# Patient Record
Sex: Female | Born: 1960 | Race: Black or African American | Hispanic: No | Smoking: Never smoker
Health system: Southern US, Community
[De-identification: ages and names within clinical notes are randomized; demographics above are authoritative.]

## PROBLEM LIST (undated history)

## (undated) DIAGNOSIS — K769 Liver disease, unspecified: Secondary | ICD-10-CM

## (undated) DIAGNOSIS — E785 Hyperlipidemia, unspecified: Secondary | ICD-10-CM

## (undated) DIAGNOSIS — R87619 Unspecified abnormal cytological findings in specimens from cervix uteri: Secondary | ICD-10-CM

## (undated) DIAGNOSIS — K5190 Ulcerative colitis, unspecified, without complications: Secondary | ICD-10-CM

## (undated) DIAGNOSIS — K5792 Diverticulitis of intestine, part unspecified, without perforation or abscess without bleeding: Secondary | ICD-10-CM

## (undated) HISTORY — DX: Ulcerative colitis, unspecified, without complications: K51.90

## (undated) HISTORY — PX: VARICOSE VEIN SURGERY: SHX832

## (undated) HISTORY — PX: COLONOSCOPY: SHX174

## (undated) HISTORY — PX: OTHER SURGICAL HISTORY: SHX169

## (undated) HISTORY — DX: Diverticulitis of intestine, part unspecified, without perforation or abscess without bleeding: K57.92

## (undated) HISTORY — DX: Unspecified abnormal cytological findings in specimens from cervix uteri: R87.619

## (undated) HISTORY — DX: Liver disease, unspecified: K76.9

## (undated) HISTORY — DX: Hyperlipidemia, unspecified: E78.5

## (undated) HISTORY — PX: DILATION AND CURETTAGE OF UTERUS: SHX78

---

## 1987-10-25 HISTORY — PX: TUBAL LIGATION: SHX77

## 2002-10-24 DIAGNOSIS — R87619 Unspecified abnormal cytological findings in specimens from cervix uteri: Secondary | ICD-10-CM

## 2002-10-24 HISTORY — PX: COLPOSCOPY: SHX161

## 2002-10-24 HISTORY — DX: Unspecified abnormal cytological findings in specimens from cervix uteri: R87.619

## 2004-08-10 ENCOUNTER — Emergency Department: Payer: Self-pay | Admitting: Emergency Medicine

## 2004-10-21 ENCOUNTER — Ambulatory Visit: Payer: Self-pay

## 2005-01-13 ENCOUNTER — Ambulatory Visit: Payer: Self-pay

## 2005-12-16 ENCOUNTER — Ambulatory Visit: Payer: Self-pay

## 2007-01-15 ENCOUNTER — Ambulatory Visit: Payer: Self-pay

## 2008-01-17 ENCOUNTER — Ambulatory Visit: Payer: Self-pay

## 2008-11-04 ENCOUNTER — Ambulatory Visit: Payer: Self-pay

## 2009-02-05 ENCOUNTER — Ambulatory Visit: Payer: Self-pay

## 2009-11-16 ENCOUNTER — Ambulatory Visit: Payer: Self-pay | Admitting: Internal Medicine

## 2010-02-09 ENCOUNTER — Ambulatory Visit: Payer: Self-pay

## 2010-11-03 ENCOUNTER — Ambulatory Visit: Payer: Self-pay | Admitting: Internal Medicine

## 2011-03-02 ENCOUNTER — Ambulatory Visit: Payer: Self-pay

## 2012-03-28 ENCOUNTER — Ambulatory Visit: Payer: Self-pay

## 2013-05-28 ENCOUNTER — Ambulatory Visit: Payer: Self-pay | Admitting: Physical Medicine and Rehabilitation

## 2013-05-28 ENCOUNTER — Ambulatory Visit: Payer: Self-pay

## 2014-04-03 DIAGNOSIS — M5126 Other intervertebral disc displacement, lumbar region: Secondary | ICD-10-CM | POA: Insufficient documentation

## 2014-04-06 DIAGNOSIS — E7800 Pure hypercholesterolemia, unspecified: Secondary | ICD-10-CM | POA: Insufficient documentation

## 2014-05-30 DIAGNOSIS — K219 Gastro-esophageal reflux disease without esophagitis: Secondary | ICD-10-CM | POA: Insufficient documentation

## 2014-09-10 ENCOUNTER — Ambulatory Visit: Payer: Self-pay | Admitting: Internal Medicine

## 2014-09-17 ENCOUNTER — Ambulatory Visit: Payer: Self-pay | Admitting: Gastroenterology

## 2015-07-21 LAB — HM PAP SMEAR: HM Pap smear: NEGATIVE

## 2015-09-07 ENCOUNTER — Other Ambulatory Visit: Payer: Self-pay | Admitting: Unknown Physician Specialty

## 2015-09-07 DIAGNOSIS — Z1231 Encounter for screening mammogram for malignant neoplasm of breast: Secondary | ICD-10-CM

## 2015-09-16 ENCOUNTER — Ambulatory Visit
Admission: RE | Admit: 2015-09-16 | Discharge: 2015-09-16 | Disposition: A | Discharge status: Home / Self Care | Payer: BC Managed Care – PPO | Source: Ambulatory Visit | Attending: Unknown Physician Specialty | Admitting: Unknown Physician Specialty

## 2015-09-16 DIAGNOSIS — Z1231 Encounter for screening mammogram for malignant neoplasm of breast: Secondary | ICD-10-CM | POA: Insufficient documentation

## 2016-09-07 DIAGNOSIS — K7581 Nonalcoholic steatohepatitis (NASH): Secondary | ICD-10-CM | POA: Insufficient documentation

## 2016-09-20 ENCOUNTER — Other Ambulatory Visit: Payer: Self-pay | Admitting: Obstetrics and Gynecology

## 2016-09-20 DIAGNOSIS — Z13820 Encounter for screening for osteoporosis: Secondary | ICD-10-CM

## 2016-09-20 DIAGNOSIS — Z1231 Encounter for screening mammogram for malignant neoplasm of breast: Secondary | ICD-10-CM

## 2016-10-11 ENCOUNTER — Ambulatory Visit: Payer: BC Managed Care – PPO

## 2016-10-11 ENCOUNTER — Other Ambulatory Visit: Payer: BC Managed Care – PPO

## 2016-11-07 ENCOUNTER — Other Ambulatory Visit: Payer: Self-pay | Admitting: Obstetrics and Gynecology

## 2016-11-07 ENCOUNTER — Ambulatory Visit
Admission: RE | Admit: 2016-11-07 | Discharge: 2016-11-07 | Disposition: A | Discharge status: Home / Self Care | Payer: BC Managed Care – PPO | Source: Ambulatory Visit | Attending: Obstetrics and Gynecology | Admitting: Obstetrics and Gynecology

## 2016-11-07 DIAGNOSIS — M85851 Other specified disorders of bone density and structure, right thigh: Secondary | ICD-10-CM | POA: Diagnosis not present

## 2016-11-07 DIAGNOSIS — Z1231 Encounter for screening mammogram for malignant neoplasm of breast: Secondary | ICD-10-CM | POA: Diagnosis not present

## 2016-11-07 DIAGNOSIS — Z13820 Encounter for screening for osteoporosis: Secondary | ICD-10-CM | POA: Insufficient documentation

## 2016-11-07 LAB — HM MAMMOGRAPHY: HM MAMMO: NORMAL (ref 0–4)

## 2017-09-01 ENCOUNTER — Encounter: Payer: Self-pay | Admitting: Obstetrics and Gynecology

## 2017-09-04 ENCOUNTER — Encounter: Payer: Self-pay | Admitting: Obstetrics and Gynecology

## 2017-09-04 ENCOUNTER — Ambulatory Visit (INDEPENDENT_AMBULATORY_CARE_PROVIDER_SITE_OTHER): Payer: BC Managed Care – PPO | Admitting: Obstetrics and Gynecology

## 2017-09-04 VITALS — BP 118/80 | HR 85 | Ht 63.0 in | Wt 137.0 lb

## 2017-09-04 DIAGNOSIS — Z1231 Encounter for screening mammogram for malignant neoplasm of breast: Secondary | ICD-10-CM | POA: Diagnosis not present

## 2017-09-04 DIAGNOSIS — N952 Postmenopausal atrophic vaginitis: Secondary | ICD-10-CM | POA: Diagnosis not present

## 2017-09-04 DIAGNOSIS — Z1239 Encounter for other screening for malignant neoplasm of breast: Secondary | ICD-10-CM

## 2017-09-04 DIAGNOSIS — Z01419 Encounter for gynecological examination (general) (routine) without abnormal findings: Secondary | ICD-10-CM | POA: Diagnosis not present

## 2017-09-04 DIAGNOSIS — Z124 Encounter for screening for malignant neoplasm of cervix: Secondary | ICD-10-CM | POA: Diagnosis not present

## 2017-09-04 MED ORDER — VALACYCLOVIR HCL 1 G PO TABS: 1000.0000 mg | ORAL_TABLET | Freq: Every day | ORAL | 6 refills | Status: DC

## 2017-09-04 MED ORDER — ESTROGENS, CONJUGATED 0.625 MG/GM VA CREA: 1.0000 | TOPICAL_CREAM | VAGINAL | 3 refills | Status: DC

## 2017-09-04 NOTE — Progress Notes (Signed)
Gynecology Annual Exam  PCP: Kirk Ruths, MD  Chief Complaint:  Chief Complaint  Patient presents with  . Gynecologic Exam    History of Present Illness:Patient is a 56 y.o. G2P2002 presents for annual exam. The patient has no complaints today.   LMP: No LMP recorded. Patient is postmenopausal. No postmenopausal bleeding  The patient is sexually active. She does report dyspareunia, is interested in restarting premarin.  The patient does perform self breast exams.  There is no notable family history of breast or ovarian cancer in her family.  The patient wears seatbelts: yes.   The patient has regular exercise: not asked.    The patient denies current symptoms of depression.     Review of Systems: Review of Systems  Constitutional: Negative for chills and fever.  HENT: Negative for congestion.   Respiratory: Negative for cough and shortness of breath.   Cardiovascular: Negative for chest pain and palpitations.  Gastrointestinal: Negative for abdominal pain, constipation, diarrhea, heartburn, nausea and vomiting.  Genitourinary: Negative for dysuria, frequency and urgency.  Skin: Negative for itching and rash.  Neurological: Negative for dizziness and headaches.  Endo/Heme/Allergies: Negative for polydipsia.  Psychiatric/Behavioral: Negative for depression.    Past Medical History:  Past Medical History:  Diagnosis Date  . Abnormal Pap smear of cervix 2004  . Diverticulitis   . Hyperlipidemia   . Liver disease    Fatty liver  . Ulcerative colitis [facility])     Past Surgical History:  Past Surgical History:  Procedure Laterality Date  . COLONOSCOPY    . COLPOSCOPY  2004  . DILATION AND CURETTAGE OF UTERUS    . TUBAL LIGATION  1989  . VARICOSE VEIN SURGERY    . vulvar hematoma      Gynecologic History:  No LMP recorded. Patient is postmenopausal. Last Pap: Results were: 07/21/2015 NIL and HR HPV negative  Last mammogram: 11/07/2016 Results were: BI-RAD  I  [place] 04/05/2016 Normal with EMS of 2.32mm obtained for an episode of postmenopausal bleeding  Obstetric History: [id number]  Family History:  Family History  Problem Relation Age of Onset  . Breast cancer Paternal Aunt 37  . Breast cancer Maternal Grandmother 80  . Diabetes Mother   . Hypertension Mother   . Prostate cancer Father   . Colon cancer Paternal Aunt     Social History:  Social History   Socioeconomic History  . Marital status: Widowed    Spouse name: Not on file  . Number of children: Not on file  . Years of education: Not on file  . Highest education level: Not on file  Social Needs  . Financial resource strain: Not on file  . Food insecurity - worry: Not on file  . Food insecurity - inability: Not on file  . Transportation needs - medical: Not on file  . Transportation needs - non-medical: Not on file  Occupational History  . Not on file  Tobacco Use  . Smoking status: Never Smoker  . Smokeless tobacco: Never Used  Substance and Sexual Activity  . Alcohol use: No    Frequency: Never  . Drug use: No  . Sexual activity: Yes    Partners: Male    Birth control/protection: Post-menopausal  Other Topics Concern  . Not on file  Social History Narrative  . Not on file    Allergies:  No Known Allergies  Medications: Prior to Admission medications   Not on File    Physical Exam  Vitals: Blood pressure 118/80, pulse 85, height 5\' 3"  (1.6 m), weight 137 lb (62.1 kg).  General: NAD HEENT: normocephalic, anicteric Thyroid: no enlargement, no palpable nodules Pulmonary: No increased work of breathing, CTAB Cardiovascular: RRR, distal pulses 2+ Breast: Breast symmetrical, no tenderness, no palpable nodules or masses, no skin or nipple retraction present, no nipple discharge.  No axillary or supraclavicular lymphadenopathy. Abdomen: NABS, soft, non-tender, non-distended.  Umbilicus without lesions.  No hepatomegaly, splenomegaly or masses palpable. No  evidence of hernia  Genitourinary:  External: Normal external female genitalia.  Normal urethral meatus, normal  Bartholin's and Skene's glands.    Vagina: Normal vaginal mucosa, no evidence of prolapse, some loss of vaginal rugae consistent with vaginal atrophy.    Cervix: Grossly normal in appearance, no bleeding  Uterus: Non-enlarged, mobile, normal contour.  No CMT  Adnexa: ovaries non-enlarged, no adnexal masses  Rectal: deferred  Lymphatic: no evidence of inguinal lymphadenopathy Extremities: no edema, erythema, or tenderness Neurologic: Grossly intact Psychiatric: mood appropriate, affect full  Female chaperone present for pelvic and breast  portions of the physical exam     Assessment: 56 y.o. G2P2002 routine annual exam  Plan: Problem List Items Addressed This Visit    None    Visit Diagnoses    Vaginal atrophy    -  Primary   Screening for malignant neoplasm of cervix       Relevant Orders   PapIG, HPV, rfx 16/18   Breast screening       Relevant Orders   MM DIGITAL SCREENING BILATERAL   Encounter for gynecological examination without abnormal finding       Relevant Orders   PapIG, HPV, rfx 16/18      1) Mammogram - recommend yearly screening mammogram.  Mammogram Is up to date  2) STI screening was not offered  3) ASCCP guidelines and rational discussed.  Patient opts for every 3 years screening interval  4) Osteopenia - per USPTF routine screening DEXA at age 39 given UC and steroid exposure obtained 11/07/16 with T score -2.1 (osteopenia) Hip 0.745g/cm^2  - FRAX 10 year major fracture risk 8.4%,  10 year hip fracture risk 1.2% - Follow up DEXA 2 years 2020  Consider FDA-approved medical therapies in postmenopausal women and men aged 67 years and older, based on the following: a) A hip or vertebral (clinical or morphometric) fracture b) T-score ? -2.5 at the femoral neck or spine after appropriate evaluation to exclude secondary causes C) Low bone mass  (T-score between -1.0 and -2.5 at the femoral neck or spine) and a 10-year probability of a hip fracture ? 3% or a 10-year probability of a major osteoporosis-related fracture ? 20% based on the [place]-adapted WHO algorithm   5) Routine healthcare maintenance including cholesterol, diabetes screening discussed managed by PCP  6) Colonoscopy - up to date last 05/01/2014  7) Vaginal atrophy - rx premarin  8) Follow up 1 year for routine annual

## 2017-09-04 NOTE — Patient Instructions (Signed)
Preventive Care 40-64 Years, Female Preventive care refers to lifestyle choices and visits with your health care provider that can promote health and wellness. What does preventive care include?  A yearly physical exam. This is also called an annual well check.  Dental exams once or twice a year.  Routine eye exams. Ask your health care provider how often you should have your eyes checked.  Personal lifestyle choices, including: ? Daily care of your teeth and gums. ? Regular physical activity. ? Eating a healthy diet. ? Avoiding tobacco and drug use. ? Limiting alcohol use. ? Practicing safe sex. ? Taking low-dose aspirin daily starting at age 56. ? Taking vitamin and mineral supplements as recommended by your health care provider. What happens during an annual well check? The services and screenings done by your health care provider during your annual well check will depend on your age, overall health, lifestyle risk factors, and family history of disease. Counseling Your health care provider may ask you questions about your:  Alcohol use.  Tobacco use.  Drug use.  Emotional well-being.  Home and relationship well-being.  Sexual activity.  Eating habits.  Work and work Statistician.  Method of birth control.  Menstrual cycle.  Pregnancy history.  Screening You may have the following tests or measurements:  Height, weight, and BMI.  Blood pressure.  Lipid and cholesterol levels. These may be checked every 5 years, or more frequently if you are over 56 years old.  Skin check.  Lung cancer screening. You may have this screening every year starting at age 56 if you have a 30-pack-year history of smoking and currently smoke or have quit within the past 15 years.  Fecal occult blood test (FOBT) of the stool. You may have this test every year starting at age 56.  Flexible sigmoidoscopy or colonoscopy. You may have a sigmoidoscopy every 5 years or a colonoscopy  every 10 years starting at age 56.  Hepatitis C blood test.  Hepatitis B blood test.  Sexually transmitted disease (STD) testing.  Diabetes screening. This is done by checking your blood sugar (glucose) after you have not eaten for a while (fasting). You may have this done every 1-3 years.  Mammogram. This may be done every 1-2 years. Talk to your health care provider about when you should start having regular mammograms. This may depend on whether you have a family history of breast cancer.  BRCA-related cancer screening. This may be done if you have a family history of breast, ovarian, tubal, or peritoneal cancers.  Pelvic exam and Pap test. This may be done every 3 years starting at age 56. Starting at age 56, this may be done every 5 years if you have a Pap test in combination with an HPV test.  Bone density scan. This is done to screen for osteoporosis. You may have this scan if you are at high risk for osteoporosis.  Discuss your test results, treatment options, and if necessary, the need for more tests with your health care provider. Vaccines Your health care provider may recommend certain vaccines, such as:  Influenza vaccine. This is recommended every year.  Tetanus, diphtheria, and acellular pertussis (Tdap, Td) vaccine. You may need a Td booster every 10 years.  Varicella vaccine. You may need this if you have not been vaccinated.  Zoster vaccine. You may need this after age 5.  Measles, mumps, and rubella (MMR) vaccine. You may need at least one dose of MMR if you were born in  1957 or later. You may also need a second dose.  Pneumococcal 13-valent conjugate (PCV13) vaccine. You may need this if you have certain conditions and were not previously vaccinated.  Pneumococcal polysaccharide (PPSV23) vaccine. You may need one or two doses if you smoke cigarettes or if you have certain conditions.  Meningococcal vaccine. You may need this if you have certain  conditions.  Hepatitis A vaccine. You may need this if you have certain conditions or if you travel or work in places where you may be exposed to hepatitis A.  Hepatitis B vaccine. You may need this if you have certain conditions or if you travel or work in places where you may be exposed to hepatitis B.  Haemophilus influenzae type b (Hib) vaccine. You may need this if you have certain conditions.  Talk to your health care provider about which screenings and vaccines you need and how often you need them. This information is not intended to replace advice given to you by your health care provider. Make sure you discuss any questions you have with your health care provider. Document Released: 11/06/2015 Document Revised: 06/29/2016 Document Reviewed: 08/11/2015 Elsevier Interactive Patient Education  2017 [employer].

## 2017-09-06 LAB — PAPIG, HPV, RFX 16/18
HPV, HIGH-RISK: NEGATIVE
PAP SMEAR COMMENT: 0

## 2017-11-24 ENCOUNTER — Encounter: Payer: Self-pay | Admitting: Obstetrics and Gynecology

## 2017-11-24 ENCOUNTER — Ambulatory Visit: Payer: BC Managed Care – PPO | Admitting: Obstetrics and Gynecology

## 2017-11-24 ENCOUNTER — Ambulatory Visit
Admission: RE | Admit: 2017-11-24 | Discharge: 2017-11-24 | Disposition: A | Discharge status: Home / Self Care | Payer: BC Managed Care – PPO | Source: Ambulatory Visit | Attending: Obstetrics and Gynecology | Admitting: Obstetrics and Gynecology

## 2017-11-24 VITALS — BP 118/78 | HR 80 | Ht 63.0 in | Wt 139.0 lb

## 2017-11-24 DIAGNOSIS — N3090 Cystitis, unspecified without hematuria: Secondary | ICD-10-CM

## 2017-11-24 DIAGNOSIS — N760 Acute vaginitis: Secondary | ICD-10-CM | POA: Diagnosis not present

## 2017-11-24 DIAGNOSIS — Z1231 Encounter for screening mammogram for malignant neoplasm of breast: Secondary | ICD-10-CM | POA: Insufficient documentation

## 2017-11-24 DIAGNOSIS — Z1239 Encounter for other screening for malignant neoplasm of breast: Secondary | ICD-10-CM

## 2017-11-24 LAB — POCT URINALYSIS DIPSTICK
Bilirubin, UA: NEGATIVE
Glucose, UA: NEGATIVE
Ketones, UA: NEGATIVE
Nitrite, UA: NEGATIVE
PH UA: 5 (ref 5.0–8.0)
Protein, UA: NEGATIVE
SPEC GRAV UA: 1.01 (ref 1.010–1.025)
UROBILINOGEN UA: NEGATIVE U/dL — AB

## 2017-11-24 MED ORDER — PHENAZOPYRIDINE HCL 200 MG PO TABS: 200.0000 mg | ORAL_TABLET | Freq: Three times a day (TID) | ORAL | 1 refills | Status: DC | PRN

## 2017-11-24 MED ORDER — SULFAMETHOXAZOLE-TRIMETHOPRIM 800-160 MG PO TABS: 1.0000 | ORAL_TABLET | Freq: Two times a day (BID) | ORAL | 0 refills | Status: AC

## 2017-11-24 NOTE — Progress Notes (Signed)
HPI:      [name] is a 56 y.o. G2P2002 who LMP was No LMP recorded. Patient is postmenopausal., presents today for a problem visit.  She complains of a recent yeast infection. She was treated for this by her primary care physician who prescribed her diflucan 100mg for 7 days. She has seen white thick discharge without any odor. She has developed painful urination since she began her diflucan.   PMHx: She  has a past medical history of Abnormal Pap smear of cervix (2004), Diverticulitis, Hyperlipidemia, Liver disease, and Ulcerative colitis (HCC). Also,  has a past surgical history that includes Colonoscopy; Colposcopy (2004); Dilation and curettage of uterus; Tubal ligation (1989); Varicose vein surgery; and vulvar hematoma., family history includes Breast cancer (age of onset: 80) in her maternal grandmother and paternal aunt; Colon cancer in her paternal aunt; Diabetes in her mother; Hypertension in her mother; Prostate cancer in her father.,  reports that  has never smoked. she has never used smokeless tobacco. She reports that she does not drink alcohol or use drugs.  She has a current medication list which includes the following prescription(s): cholecalciferol, conjugated estrogens, dicyclomine, ketoconazole, mometasone, multi-vitamins, omeprazole, pramoxine-hc, suprep bowel prep kit, phenazopyridine, and sulfamethoxazole-trimethoprim. Also, is allergic to metronidazole.  Review of Systems  Constitutional: Negative for chills, fever, malaise/fatigue and weight loss.  HENT: Negative for congestion, hearing loss and sinus pain.   Eyes: Negative for blurred vision and double vision.  Respiratory: Negative for cough, sputum production, shortness of breath and wheezing.   Cardiovascular: Negative for chest pain, palpitations, orthopnea and leg swelling.  Gastrointestinal: Negative for abdominal pain, constipation, diarrhea, nausea and vomiting.  Genitourinary: Positive for dysuria.  Negative for flank pain, frequency, hematuria and urgency.  Musculoskeletal: Negative for back pain, falls and joint pain.  Skin: Negative for itching and rash.  Neurological: Negative for dizziness and headaches.  Psychiatric/Behavioral: Negative for depression, substance abuse and suicidal ideas. The patient is not nervous/anxious.     Objective: BP 118/78 (BP Location: Left Arm, Patient Position: Sitting, Cuff Size: Normal)   Pulse 80   Ht 5' 3" (1.6 m)   Wt 139 lb (63 kg)   BMI 24.62 kg/m  Physical Exam  Constitutional: She is oriented to person, place, and time. She appears well-developed.  Genitourinary: Vagina normal and uterus normal. There is no lesion on the right labia. There is no lesion on the left labia. Vagina exhibits no lesion. Right adnexum does not display mass. Left adnexum does not display mass. Cervix does not exhibit motion tenderness or discharge.  HENT:  Head: Normocephalic and atraumatic.  Eyes: EOM are normal.  Neck: Neck supple. No thyromegaly present.  Cardiovascular: Normal rate, regular rhythm and normal heart sounds.  Pulmonary/Chest: Effort normal and breath sounds normal. Right breast exhibits no inverted nipple, no mass, no nipple discharge and no skin change. Left breast exhibits no inverted nipple, no mass, no nipple discharge and no skin change.  Abdominal: Soft. Bowel sounds are normal. She exhibits no distension and no mass.  Neurological: She is alert and oriented to person, place, and time.  Skin: Skin is warm and dry.  Psychiatric: She has a normal mood and affect. Her behavior is normal. Judgment and thought content normal.  Vitals reviewed.   ASSESSMENT/PLAN:    Will treat patient for UTI. No sign of vaginitis on exam today. Will send Nuswab to confirm.  Problem List Items Addressed This Visit    None      Visit Diagnoses    Cystitis    -  Primary   Relevant Medications   sulfamethoxazole-trimethoprim (BACTRIM DS,SEPTRA DS) 800-160 MG  tablet   phenazopyridine (PYRIDIUM) 200 MG tablet   Other Relevant Orders   Urine Culture   Acute vaginitis       Relevant Orders   NuSwab BV and Candida, NAA      Nikolina Simerson MD 11/24/17 9:32 AM

## 2017-11-26 LAB — URINE CULTURE

## 2017-11-27 NOTE — Progress Notes (Signed)
Culture negative, called and left generic message.  Released to [place].

## 2017-11-28 LAB — NUSWAB BV AND CANDIDA, NAA
CANDIDA ALBICANS, NAA: NEGATIVE
Candida glabrata, NAA: NEGATIVE

## 2017-11-28 NOTE — Progress Notes (Signed)
Normal, released to mychart

## 2018-05-25 DIAGNOSIS — T466X5A Adverse effect of antihyperlipidemic and antiarteriosclerotic drugs, initial encounter: Secondary | ICD-10-CM | POA: Insufficient documentation

## 2018-11-05 ENCOUNTER — Ambulatory Visit (INDEPENDENT_AMBULATORY_CARE_PROVIDER_SITE_OTHER): Payer: BC Managed Care – PPO | Admitting: Obstetrics & Gynecology

## 2018-11-05 ENCOUNTER — Encounter: Payer: Self-pay | Admitting: Obstetrics & Gynecology

## 2018-11-05 VITALS — BP 120/80 | Ht 63.0 in | Wt 138.0 lb

## 2018-11-05 DIAGNOSIS — R300 Dysuria: Secondary | ICD-10-CM

## 2018-11-05 DIAGNOSIS — B3731 Acute candidiasis of vulva and vagina: Secondary | ICD-10-CM

## 2018-11-05 DIAGNOSIS — B373 Candidiasis of vulva and vagina: Secondary | ICD-10-CM

## 2018-11-05 DIAGNOSIS — N3090 Cystitis, unspecified without hematuria: Secondary | ICD-10-CM

## 2018-11-05 LAB — POCT URINALYSIS DIPSTICK
Bilirubin, UA: NEGATIVE
Blood, UA: NEGATIVE
Glucose, UA: NEGATIVE
Ketones, UA: NEGATIVE
NITRITE UA: NEGATIVE
PROTEIN UA: NEGATIVE
Spec Grav, UA: 1.015 (ref 1.010–1.025)
Urobilinogen, UA: 0.2 E.U./dL
pH, UA: 5 (ref 5.0–8.0)

## 2018-11-05 MED ORDER — SULFAMETHOXAZOLE-TRIMETHOPRIM 800-160 MG PO TABS: 1.0000 | ORAL_TABLET | Freq: Two times a day (BID) | ORAL | 0 refills | Status: AC

## 2018-11-05 MED ORDER — FLUCONAZOLE 150 MG PO TABS
150.0000 mg | ORAL_TABLET | Freq: Once | ORAL | 3 refills | Status: AC
Start: 2018-11-05 — End: 2018-11-05

## 2018-11-05 MED ORDER — CLOTRIMAZOLE-BETAMETHASONE 1-0.05 % EX CREA: 1.0000 "application " | TOPICAL_CREAM | Freq: Two times a day (BID) | CUTANEOUS | 0 refills | Status: AC

## 2018-11-05 NOTE — Patient Instructions (Signed)
Betamethasone; Clotrimazole skin cream What is this medicine? BETAMETHASONE; CLOTRIMAZOLE (bay ta METH a sone; kloe TRIM a zole) is a corticosteroid and antifungal cream. It treats ringworm and infections like jock itch and athlete's foot. It also helps reduce swelling, redness, and itching caused by these infections. This medicine may be used for other purposes; ask your health care provider or pharmacist if you have questions. COMMON BRAND NAME(S): Lotrisone What should I tell my health care provider before I take this medicine? They need to know if you have any of these conditions: -large areas of burned or damaged skin -skin thinning -peripheral vascular disease or poor circulation -an unusual or allergic reaction to betamethasone, clotrimazole, other corticosteroids, other antifungals, other medicines, foods, dyes, or preservatives -pregnant or trying to get pregnant -breast-feeding How should I use this medicine? This cream is for external use only. Do not take by mouth. Follow the directions on the prescription label. Wash your hands before and after use. If treating hand or nail infections, wash hands before use only. Apply a thin layer of cream to the affected area and rub in gently. Do not cover or wrap the treated area with an airtight bandage (like a plastic bandage). Use the cream for the full course of treatment prescribed, even if you think the condition is getting better. Use the medicine at regular intervals. Do not use more often than directed. Do not use on healthy skin or over large areas of skin. Do not use this medicine for any condition other than the one for which it was prescribed. When applying to the groin area, apply a small amount and do not use for longer than 2 weeks unless directed to by your doctor or health care professional. Do not get this cream in your eyes. If you do, rinse out with plenty of cool tap water. Talk to your pediatrician regarding the use of this  medicine in children. While this drug may be prescribed for children as young as 17 years for selected conditions, precautions do apply. Patients over 24 years old may have a stronger reaction and need a smaller dose. Overdosage: If you think you have taken too much of this medicine contact a poison control center or emergency room at once. NOTE: This medicine is only for you. Do not share this medicine with others. What if I miss a dose? If you miss a dose, use it as soon as you can. If it is almost time for your next dose, use only that dose. Do not use double or take extra doses. What may interact with this medicine? -topical products that have nystatin This list may not describe all possible interactions. Give your health care provider a list of all the medicines, herbs, non-prescription drugs, or dietary supplements you use. Also tell them if you smoke, drink alcohol, or use illegal drugs. Some items may interact with your medicine. What should I watch for while using this medicine? If using this medicine on your body or groin tell your doctor or health care professional if your symptoms do not improve within 1 week. If using this medicine on your feet tell your doctor or health care professional if your symptoms do not improve within 2 weeks. Tell your doctor if your skin infection returns after you stop using this cream. If you are using this cream for 'jock itch' be sure to dry the groin completely after bathing. Do not wear underwear that is tight-fitting or made from synthetic fibers like rayon or  nylon. Wear loose-fitting, cotton underwear. If you are using this cream for athlete's foot be sure to dry your feet carefully after bathing, especially between the toes. Do not wear socks made from wool or synthetic materials like rayon or nylon. Wear clean cotton socks and change them at least once a day, change them more if your feet sweat a lot. Also, try to wear sandals or shoes that are  well-ventilated. Do not use this cream to treat diaper rash. What side effects may I notice from receiving this medicine? Side effects that you should report to your doctor or health care professional as soon as possible: -allergic reactions like skin rash, itching or hives, swelling of the face, lips, or tongue -dark red spots on the skin -lack of healing of skin condition -loss of feeling on skin -painful, red, pus-filled blisters in hair follicles -skin infection -sores or blisters that do not heal properly -thinning of the skin or sunburn Side effects that usually do not require medical attention (report to your doctor or health care professional if they continue or are bothersome): -dry or peeling skin -minor skin irritation, burning, or itching This list may not describe all possible side effects. Call your doctor for medical advice about side effects. You may report side effects to FDA at 1-800-FDA-1088. Where should I keep my medicine? Keep out of the reach of children. Store at room temperature between 15 and 30 degrees C ( 59 and 86 degrees F). Do not freeze. Throw away any unused medicine after the expiration date. NOTE: This sheet is a summary. It may not cover all possible information. If you have questions about this medicine, talk to your doctor, pharmacist, or health care provider.  2019 Elsevier/Gold Standard (2008-01-09 16:14:28)

## 2018-11-05 NOTE — Addendum Note (Signed)
Addended by: Gae Dry on: 11/05/2018 05:00 PM   Modules accepted: Orders

## 2018-11-05 NOTE — Progress Notes (Signed)
HPI:      [name] is a 58 y.o. [phone] who LMP was No LMP recorded. Patient is postmenopausal., presents today for a problem visit.    Urinary Tract Infection: Patient complains of suprapubic pressure . She has had symptoms for 3 days. Patient also complains of left sided vulvar itch and discomfort.  Has had prior BV, also prior UTI one year ago.  Scant vag d/c.  No fever, other sx's.. Patient denies back pain, fever and headache. Patient does not have a history of recurrent UTI.  Patient does not have a history of pyelonephritis.  ALso has occas LLQ pain w radiation down LLE.  Known h/o diverticuolsis, feels it may be related to that.  No period.  PMHx: She  has a past medical history of Abnormal Pap smear of cervix (2004), Diverticulitis, Hyperlipidemia, Liver disease, and Ulcerative colitis ([place]). Also,  has a past surgical history that includes Colonoscopy; Colposcopy (2004); Dilation and curettage of uterus; Tubal ligation (1989); Varicose vein surgery; and vulvar hematoma., family history includes Breast cancer (age of onset: 46) in her maternal grandmother and paternal aunt; Colon cancer in her paternal aunt; Diabetes in her mother; Hypertension in her mother; Prostate cancer in her father.,  reports that she has never smoked. She has never used smokeless tobacco. She reports that she does not drink alcohol or use drugs.  She has a current medication list which includes the following prescription(s): cholecalciferol, conjugated estrogens, dicyclomine, ketoconazole, mometasone, multi-vitamins, omeprazole, phenazopyridine, pramoxine-hc, suprep bowel prep kit, clotrimazole-betamethasone, fluconazole, and sulfamethoxazole-trimethoprim. Also, is allergic to metronidazole.  Review of Systems  Constitutional: Negative for chills, fever and malaise/fatigue.  HENT: Negative for congestion, sinus pain and sore throat.   Eyes: Negative for blurred vision and pain.  Respiratory: Negative for  cough and wheezing.   Cardiovascular: Negative for chest pain and leg swelling.  Gastrointestinal: Negative for abdominal pain, constipation, diarrhea, heartburn, nausea and vomiting.  Genitourinary: Negative for dysuria, frequency, hematuria and urgency.  Musculoskeletal: Negative for back pain, joint pain, myalgias and neck pain.  Skin: Negative for itching and rash.  Neurological: Negative for dizziness, tremors and weakness.  Endo/Heme/Allergies: Does not bruise/bleed easily.  Psychiatric/Behavioral: Negative for depression. The patient is not nervous/anxious and does not have insomnia.     Objective: BP 120/80   Ht 5' 3"  (1.6 m)   Wt 138 lb (62.6 kg)   BMI 24.45 kg/m  Physical Exam Constitutional:      General: She is not in acute distress.    Appearance: She is well-developed.  Genitourinary:     Pelvic exam was performed with patient supine.     Vagina and uterus normal.     No vaginal erythema or bleeding.     No cervical motion tenderness, discharge, polyp or nabothian cyst.     Uterus is mobile.     Uterus is not enlarged.     No uterine mass detected.    Uterus is midaxial.     No right or left adnexal mass present.     Right adnexa not tender.     Left adnexa not tender.  HENT:     Head: Normocephalic and atraumatic.     Nose: Nose normal.  Abdominal:     General: Bowel sounds are normal. There is no distension.     Palpations: Abdomen is soft.     Tenderness: There is no abdominal tenderness.     Comments: BACK- No CVAT  Musculoskeletal: Normal range  of motion.  Neurological:     Mental Status: She is alert and oriented to person, place, and time.     Cranial Nerves: No cranial nerve deficit.  Skin:    General: Skin is warm and dry.   UA- + LEUK  WET PREP:   positive hyphae Findings are consistent with monilia vaginitis/ vulvitis  ASSESSMENT/PLAN:   Acute cystitis, Vulvovaginitis and yeast infection  Visit Diagnoses    Dysuria    -  Primary    Relevant Orders   POCT urinalysis dipstick (Completed)   Cystitis       Candida vaginitis       Relevant Medications   clotrimazole-betamethasone (LOTRISONE) cream   fluconazole (DIFLUCAN) 150 MG tablet   sulfamethoxazole-trimethoprim (BACTRIM DS) 800-160 MG tablet    Monitor for resolution of sx's  [place] if LLQ pain persists (Ovarian cyst vs divertic, vs other)  Barnett Applebaum, MD, [place], [facility] 11/05/2018  4:00 PM

## 2018-11-10 LAB — URINE CULTURE

## 2018-11-30 ENCOUNTER — Telehealth: Payer: Self-pay

## 2018-11-30 NOTE — Telephone Encounter (Signed)
Pt needs the name of medications PH rx'd at her last visit.  [phone]  Left detailed msg on 11/05/18 [place] rx'd lotrisone, diflucan, and bactrim ds

## 2018-12-05 ENCOUNTER — Encounter: Payer: Self-pay | Admitting: Obstetrics and Gynecology

## 2018-12-05 ENCOUNTER — Ambulatory Visit (INDEPENDENT_AMBULATORY_CARE_PROVIDER_SITE_OTHER): Payer: BC Managed Care – PPO | Admitting: Obstetrics and Gynecology

## 2018-12-05 VITALS — BP 130/80 | Ht 61.0 in | Wt 138.0 lb

## 2018-12-05 DIAGNOSIS — N390 Urinary tract infection, site not specified: Secondary | ICD-10-CM

## 2018-12-05 DIAGNOSIS — B952 Enterococcus as the cause of diseases classified elsewhere: Secondary | ICD-10-CM

## 2018-12-05 DIAGNOSIS — M85859 Other specified disorders of bone density and structure, unspecified thigh: Secondary | ICD-10-CM

## 2018-12-05 DIAGNOSIS — Z1239 Encounter for other screening for malignant neoplasm of breast: Secondary | ICD-10-CM

## 2018-12-05 DIAGNOSIS — Z01419 Encounter for gynecological examination (general) (routine) without abnormal findings: Secondary | ICD-10-CM | POA: Diagnosis not present

## 2018-12-05 MED ORDER — VALACYCLOVIR HCL 500 MG PO TABS: 500.0000 mg | ORAL_TABLET | Freq: Two times a day (BID) | ORAL | 6 refills | Status: AC

## 2018-12-05 MED ORDER — ESTROGENS, CONJUGATED 0.625 MG/GM VA CREA: 1.0000 | TOPICAL_CREAM | VAGINAL | 3 refills | Status: DC

## 2018-12-05 NOTE — Progress Notes (Signed)
Gynecology Annual Exam  PCP: Kirk Ruths, MD  Chief Complaint:  Chief Complaint  Patient presents with  . Gynecologic Exam  . Pelvic Pain    left side    History of Present Illness:Patient is a 58 y.o. G2P2002 presents for annual exam. The patient has no complaints today.   LMP: No LMP recorded. Patient is postmenopausal.  The patient is sexually active. She denies dyspareunia.  The patient does perform self breast exams.  There is no notable family history of breast or ovarian cancer in her family.  The patient wears seatbelts: yes.   The patient has regular exercise: not asked.    The patient denies current symptoms of depression.     Review of Systems: Review of Systems  Constitutional: Negative for chills and fever.  HENT: Negative for congestion.   Respiratory: Negative for cough and shortness of breath.   Cardiovascular: Negative for chest pain and palpitations.  Gastrointestinal: Negative for abdominal pain, constipation, diarrhea, heartburn, nausea and vomiting.  Genitourinary: Negative for dysuria, frequency and urgency.  Skin: Negative for itching and rash.  Neurological: Negative for dizziness and headaches.  Endo/Heme/Allergies: Negative for polydipsia.  Psychiatric/Behavioral: Negative for depression.    Past Medical History:  Past Medical History:  Diagnosis Date  . Abnormal Pap smear of cervix 2004  . Diverticulitis   . Hyperlipidemia   . Liver disease    Fatty liver  . Ulcerative colitis [facility])     Past Surgical History:  Past Surgical History:  Procedure Laterality Date  . COLONOSCOPY    . COLPOSCOPY  2004  . DILATION AND CURETTAGE OF UTERUS    . TUBAL LIGATION  1989  . VARICOSE VEIN SURGERY    . vulvar hematoma      Gynecologic History:  No LMP recorded. Patient is postmenopausal. Last Pap: Results were: 09/04/2017 NIL and HR HPV negative  Last mammogram: 11/24/2017 Results were: BI-RAD I  Obstetric History:  [id number]  Family History:  Family History  Problem Relation Age of Onset  . Breast cancer Paternal Aunt 45  . Breast cancer Maternal Grandmother 80  . Diabetes Mother   . Hypertension Mother   . Prostate cancer Father   . Colon cancer Paternal Aunt     Social History:  Social History   Socioeconomic History  . Marital status: Widowed    Spouse name: Not on file  . Number of children: Not on file  . Years of education: Not on file  . Highest education level: Not on file  Occupational History  . Not on file  Social Needs  . Financial resource strain: Not on file  . Food insecurity:    Worry: Not on file    Inability: Not on file  . Transportation needs:    Medical: Not on file    Non-medical: Not on file  Tobacco Use  . Smoking status: Never Smoker  . Smokeless tobacco: Never Used  Substance and Sexual Activity  . Alcohol use: No    Frequency: Never  . Drug use: No  . Sexual activity: Yes    Partners: Male    Birth control/protection: Post-menopausal  Lifestyle  . Physical activity:    Days per week: 0 days    Minutes per session: 0 min  . Stress: Not at all  Relationships  . Social connections:    Talks on phone: More than three times a week    Gets together: Once a week  Attends religious service: More than 4 times per year    Active member of club or organization: No    Attends meetings of clubs or organizations: Never    Relationship status: Widowed  . Intimate partner violence:    Fear of current or ex partner: No    Emotionally abused: No    Physically abused: No    Forced sexual activity: No  Other Topics Concern  . Not on file  Social History Narrative  . Not on file    Allergies:  Allergies  Allergen Reactions  . Metronidazole Nausea Only    Medications: Prior to Admission medications   Medication Sig Start Date End Date Taking? Authorizing Provider  Cholecalciferol (VITAMIN D-1000 MAX ST) 1000 units tablet Take by mouth.   Yes  [provider]  clotrimazole-betamethasone (LOTRISONE) cream Apply 1 application topically 2 (two) times daily. 11/05/18  Yes Gae Dry, MD  conjugated estrogens (PREMARIN) vaginal cream Place 1 Applicatorful 2 (two) times a week vaginally. 09/04/17  Yes Malachy Mood, MD  dicyclomine (BENTYL) 10 MG capsule Take 1 capsule by mouth four times a day before meals as needed for pain 06/28/17  Yes [provider]  ketoconazole (NIZORAL) 2 % shampoo APPLY TO AFFECTED AREA ONCE DAILY AS DIRECTED; ALTERNATE WITH TAR SHAMPOO 10/30/17  Yes [provider]  mometasone (ELOCON) 0.1 % lotion APPLY 1 MILLILITER TO THE AFFECTED AREA OF THE SKIN ONCE DAILY AS NEEDED 10/30/17  Yes [provider]  Multiple Vitamin (MULTI-VITAMINS) TABS Take by mouth.   Yes [provider]  omeprazole (PRILOSEC OTC) 20 MG tablet Take by mouth.   Yes [provider]  phenazopyridine (PYRIDIUM) 200 MG tablet Take 1 tablet (200 mg total) by mouth 3 (three) times daily as needed for pain (urethral spasm). 11/24/17  Yes Schuman, Christanna R, MD  Pramoxine-HC (PRAMOSONE) 1-2.5 % OINT Apply topically.   Yes [provider]  Omar KIT 17.5-3.13-1.6 GM/177ML SOLN take as directed by prescriber 09/20/17  Yes [provider]  valACYclovir (VALTREX) 500 MG tablet take 1 tablet by mouth twice a day prn 06/26/17  Yes [provider]    Physical Exam Vitals: Blood pressure 130/80, height 5' 1"  (1.549 m), weight 138 lb (62.6 kg).  General: NAD HEENT: normocephalic, anicteric Thyroid: no enlargement, no palpable nodules Pulmonary: No increased work of breathing, CTAB Cardiovascular: RRR, distal pulses 2+ Breast: Breast symmetrical, no tenderness, no palpable nodules or masses, no skin or nipple retraction present, no nipple discharge.  No axillary or supraclavicular lymphadenopathy. Abdomen: NABS, soft, non-tender, non-distended.  Umbilicus without  lesions.  No hepatomegaly, splenomegaly or masses palpable. No evidence of hernia  Genitourinary:  External: Normal external female genitalia.  Normal urethral meatus, normal Bartholin's and Skene's glands.    Vagina: Normal vaginal mucosa, no evidence of prolapse.    Cervix: Grossly normal in appearance, no bleeding  Uterus: Non-enlarged, mobile, normal contour.  No CMT  Adnexa: ovaries non-enlarged, no adnexal masses  Rectal: deferred  Lymphatic: no evidence of inguinal lymphadenopathy Extremities: no edema, erythema, or tenderness Neurologic: Grossly intact Psychiatric: mood appropriate, affect full  Female chaperone present for pelvic and breast  portions of the physical exam     Assessment: 58 y.o. G2P2002 routine annual exam  Plan: Problem List Items Addressed This Visit    None    Visit Diagnoses    Encounter for gynecological examination without abnormal finding    -  Primary   Breast screening  Relevant Orders   MM 3D SCREEN BREAST BILATERAL   UTI (urinary tract infection) due to Enterococcus       Relevant Medications   valACYclovir (VALTREX) 500 MG tablet   Other Relevant Orders   Urine Culture   Osteopenia of neck of femur, unspecified laterality       Relevant Orders   DG Bone Density      1) Mammogram - recommend yearly screening mammogram.  Mammogram Was ordered today  2) STI screening  was notoffered and therefore not obtained  3) ASCCP guidelines and rational discussed.  Patient opts for every 3 years screening interval  4) Osteoporosis  - per USPTF routine screening DEXA at age 64 11/07/16 with T score -2.1 (osteopenia) Hip 0.745g/cm^2  - FRAX 10 year major fracture risk 8.4%,  10 year hip fracture risk 1.2% - Follow up DEXA 2 years 2020 - ordered  Consider FDA-approved medical therapies in postmenopausal women and men aged 53 years and older, based on the following: a) A hip or vertebral (clinical or morphometric) fracture b) T-score ? -2.5  at the femoral neck or spine after appropriate evaluation to exclude secondary causes C) Low bone mass (T-score between -1.0 and -2.5 at the femoral neck or spine) and a 10-year probability of a hip fracture ? 3% or a 10-year probability of a major osteoporosis-related fracture ? 20% based on the [place]-adapted WHO algorithm   5) Routine healthcare maintenance including cholesterol, diabetes screening discussed managed by PCP  6) Colonoscopy up to date 01/11/2018 at [facility] with repeat in 5 years  7) UTI - recent Enterococcus and Staph haemolyticus UTI.  Still some redsidual symptoms will retest today   8) Refill premarin cream and valtrex  9) Return in about 1 year (around 12/06/2019) for annual.    Malachy Mood, MD [place] Pigeon, [facility] 12/05/2018, 12:10 PM

## 2018-12-05 NOTE — Patient Instructions (Signed)
[facility] [address] [place] [address]  [facility]  [address] [place] [address]  Phone: [phone]  

## 2018-12-07 ENCOUNTER — Ambulatory Visit: Payer: BC Managed Care – PPO | Admitting: Obstetrics and Gynecology

## 2018-12-07 LAB — URINE CULTURE

## 2019-01-02 ENCOUNTER — Ambulatory Visit
Admission: RE | Admit: 2019-01-02 | Discharge: 2019-01-02 | Disposition: A | Discharge status: Home / Self Care | Payer: BC Managed Care – PPO | Source: Ambulatory Visit | Attending: Obstetrics and Gynecology | Admitting: Obstetrics and Gynecology

## 2019-01-02 ENCOUNTER — Other Ambulatory Visit: Payer: Self-pay

## 2019-01-02 DIAGNOSIS — Z1239 Encounter for other screening for malignant neoplasm of breast: Secondary | ICD-10-CM

## 2019-01-02 DIAGNOSIS — Z1231 Encounter for screening mammogram for malignant neoplasm of breast: Secondary | ICD-10-CM | POA: Insufficient documentation

## 2019-02-05 ENCOUNTER — Other Ambulatory Visit: Payer: BC Managed Care – PPO

## 2019-04-03 ENCOUNTER — Ambulatory Visit
Admission: RE | Admit: 2019-04-03 | Discharge: 2019-04-03 | Disposition: A | Discharge status: Home / Self Care | Payer: BC Managed Care – PPO | Source: Ambulatory Visit | Attending: Obstetrics and Gynecology | Admitting: Obstetrics and Gynecology

## 2019-04-03 ENCOUNTER — Other Ambulatory Visit: Payer: Self-pay

## 2019-04-03 DIAGNOSIS — M85859 Other specified disorders of bone density and structure, unspecified thigh: Secondary | ICD-10-CM | POA: Insufficient documentation

## 2019-07-30 DIAGNOSIS — K599 Functional intestinal disorder, unspecified: Secondary | ICD-10-CM | POA: Insufficient documentation

## 2019-08-23 ENCOUNTER — Other Ambulatory Visit: Payer: Self-pay | Admitting: Obstetrics and Gynecology

## 2019-08-23 ENCOUNTER — Telehealth: Payer: Self-pay

## 2019-08-23 MED ORDER — FLUCONAZOLE 150 MG PO TABS: 150.0000 mg | ORAL_TABLET | Freq: Once | ORAL | 0 refills | Status: AC

## 2019-08-23 NOTE — Telephone Encounter (Signed)
Pt calling today requesting that a RX for yeast infection be sent into [facility] in [place] on [address]. She was on antibiotic for diverticulitis and has developed a yeast infection. Please advise

## 2019-08-23 NOTE — Telephone Encounter (Signed)
Pt not seen since 12/05/18 for annual exam

## 2019-11-11 DIAGNOSIS — Z86018 Personal history of other benign neoplasm: Secondary | ICD-10-CM

## 2019-11-11 HISTORY — DX: Personal history of other benign neoplasm: Z86.018

## 2019-12-09 ENCOUNTER — Encounter: Payer: Self-pay | Admitting: Obstetrics and Gynecology

## 2019-12-09 ENCOUNTER — Other Ambulatory Visit: Payer: Self-pay

## 2019-12-09 ENCOUNTER — Ambulatory Visit (INDEPENDENT_AMBULATORY_CARE_PROVIDER_SITE_OTHER): Payer: BC Managed Care – PPO | Admitting: Obstetrics and Gynecology

## 2019-12-09 VITALS — BP 128/66 | Ht 63.0 in | Wt 137.0 lb

## 2019-12-09 DIAGNOSIS — N898 Other specified noninflammatory disorders of vagina: Secondary | ICD-10-CM | POA: Diagnosis not present

## 2019-12-09 DIAGNOSIS — Z01419 Encounter for gynecological examination (general) (routine) without abnormal findings: Secondary | ICD-10-CM

## 2019-12-09 DIAGNOSIS — Z01411 Encounter for gynecological examination (general) (routine) with abnormal findings: Secondary | ICD-10-CM | POA: Diagnosis not present

## 2019-12-09 DIAGNOSIS — R159 Full incontinence of feces: Secondary | ICD-10-CM | POA: Diagnosis not present

## 2019-12-09 DIAGNOSIS — Z1239 Encounter for other screening for malignant neoplasm of breast: Secondary | ICD-10-CM

## 2019-12-09 NOTE — Patient Instructions (Signed)
[facility] [address] [place] [address]  [facility]  [address] [place] [address]  Phone: [phone]  

## 2019-12-09 NOTE — Progress Notes (Signed)
Gynecology Annual Exam  PCP: Kirk Ruths, MD  Chief Complaint:  Chief Complaint  Patient presents with  . Gynecologic Exam    dsicharge w/ no odor    History of Present Illness:Patient is a 59 y.o. G2P2002 presents for annual exam. The patient has no complaints today.   LMP: No LMP recorded. Patient is postmenopausal. No postmenopausal bleeding  The patient does perform self breast exams.  There is no notable family history of breast or ovarian cancer in her family.  The patient wears seatbelts: yes.   The patient has regular exercise: not asked.    The patient denies current symptoms of depression.     She does reports that she noted worsening incontinence to flatus which is distressing to her.  No stool incontinence noted.    Review of Systems: Review of Systems  Constitutional: Negative for chills and fever.  HENT: Negative for congestion.   Respiratory: Negative for cough and shortness of breath.   Cardiovascular: Negative for chest pain and palpitations.  Gastrointestinal: Negative for abdominal pain, constipation, diarrhea, heartburn, nausea and vomiting.  Genitourinary: Negative for dysuria, frequency and urgency.  Skin: Negative for itching and rash.  Neurological: Negative for dizziness and headaches.  Endo/Heme/Allergies: Negative for polydipsia.  Psychiatric/Behavioral: Negative for depression.    Past Medical History:  Past Medical History:  Diagnosis Date  . Abnormal Pap smear of cervix 2004  . Diverticulitis   . Hyperlipidemia   . Liver disease    Fatty liver  . Ulcerative colitis [facility])     Past Surgical History:  Past Surgical History:  Procedure Laterality Date  . COLONOSCOPY    . COLPOSCOPY  2004  . DILATION AND CURETTAGE OF UTERUS    . TUBAL LIGATION  1989  . VARICOSE VEIN SURGERY    . vulvar hematoma      Gynecologic History:  No LMP recorded. Patient is postmenopausal. Last Pap: Results were: 09/04/2017 NIL and HR HPV  negative  Last mammogram: 01/02/2019 Results were: BI-RAD I  Obstetric [id number]  Family History:  Family History  Problem Relation Age of Onset  . Breast cancer Paternal Aunt 65  . Breast cancer Maternal Grandmother 80  . Diabetes Mother   . Hypertension Mother   . Prostate cancer Father   . Colon cancer Paternal Aunt     Social History:  Social History   Socioeconomic History  . Marital status: Widowed    Spouse name: Not on file  . Number of children: Not on file  . Years of education: Not on file  . Highest education level: Not on file  Occupational History  . Not on file  Tobacco Use  . Smoking status: Never Smoker  . Smokeless tobacco: Never Used  Substance and Sexual Activity  . Alcohol use: No  . Drug use: No  . Sexual activity: Yes    Partners: Male    Birth control/protection: Post-menopausal  Other Topics Concern  . Not on file  Social History Narrative  . Not on file   Social Determinants of Health   Financial Resource Strain:   . Difficulty of Paying Living Expenses: Not on file  Food Insecurity:   . Worried About Charity fundraiser in the Last Year: Not on file  . Ran Out of Food in the Last Year: Not on file  Transportation Needs:   . Lack of Transportation (Medical): Not on file  . Lack of Transportation (Non-Medical): Not on file  Physical Activity:   . Days of Exercise per Week: Not on file  . Minutes of Exercise per Session: Not on file  Stress:   . Feeling of Stress : Not on file  Social Connections:   . Frequency of Communication with Friends and Family: Not on file  . Frequency of Social Gatherings with Friends and Family: Not on file  . Attends Religious Services: Not on file  . Active Member of Clubs or Organizations: Not on file  . Attends Archivist Meetings: Not on file  . Marital Status: Not on file  Intimate Partner Violence:   . Fear of Current or Ex-Partner: Not on file  . Emotionally Abused: Not on file    . Physically Abused: Not on file  . Sexually Abused: Not on file    Allergies:  Allergies  Allergen Reactions  . Metronidazole Nausea Only    Medications: Prior to Admission medications   Medication Sig Start Date End Date Taking? Authorizing Provider  Cholecalciferol (VITAMIN D-1000 MAX ST) 1000 units tablet Take by mouth.   Yes [provider]  clindamycin-tretinoin (VELTIN) gel APPLY A THIN LAYER TO FACE FOR ACNE AT BEDTIME 11/01/17  Yes [provider]  clotrimazole-betamethasone (LOTRISONE) cream Apply 1 application topically 2 (two) times daily. 11/05/18  Yes Gae Dry, MD  conjugated estrogens (PREMARIN) vaginal cream PLACE 1 APPLICATORFUL  VAGINALLY TWICE A WEEK 09/15/17  Yes [provider]  dicyclomine (BENTYL) 10 MG capsule Take 1 capsule by mouth four times a day before meals as needed for pain 06/28/17  Yes [provider]  ezetimibe (ZETIA) 10 MG tablet TAKE 1 TABLET(10 MG) BY MOUTH EVERY DAY 06/03/19  Yes [provider]  mometasone (ELOCON) 0.1 % lotion APPLY 1 MILLILITER TO THE AFFECTED AREA OF THE SKIN ONCE DAILY AS NEEDED 10/30/17  Yes [provider]  Multiple Vitamin (MULTI-VITAMIN) tablet Take by mouth.   Yes [provider]  omeprazole (PRILOSEC OTC) 20 MG tablet Take by mouth.   Yes [provider]  Pramoxine-HC (PRAMOSONE) 1-2.5 % OINT Apply topically.   Yes [provider]  pravastatin (PRAVACHOL) 10 MG tablet  09/21/19  Yes [provider]    Physical Exam Vitals: Blood pressure 128/66, height 5\' 3"  (1.6 m), weight 137 lb (62.1 kg).  General: NAD HEENT: normocephalic, anicteric Thyroid: no enlargement, no palpable nodules Pulmonary: No increased work of breathing, CTAB Cardiovascular: RRR, distal pulses 2+ Breast: Breast symmetrical, no tenderness, no palpable nodules or masses, no skin or nipple retraction present, no nipple discharge.  No axillary or  supraclavicular lymphadenopathy. Abdomen: NABS, soft, non-tender, non-distended.  Umbilicus without lesions.  No hepatomegaly, splenomegaly or masses palpable. No evidence of hernia  Genitourinary:  External: Normal external female genitalia.  Normal urethral meatus, normal Bartholin's and Skene's glands.    Vagina: Normal vaginal mucosa, no evidence of prolapse.    Cervix: Grossly normal in appearance, no bleeding  Uterus: Non-enlarged, mobile, normal contour.  No CMT  Adnexa: ovaries non-enlarged, no adnexal masses  Rectal: There is decreased sphincter tone, there does appear to be a possible break in the external anal sphincter palpated at 8 O'Clock  Lymphatic: no evidence of inguinal lymphadenopathy Extremities: no edema, erythema, or tenderness Neurologic: Grossly intact Psychiatric: mood appropriate, affect full  Female chaperone present for pelvic and breast  portions of the physical exam     Assessment: 59 y.o. [id number] routine annual exam  Plan: Problem List Items Addressed This Visit  None    Visit Diagnoses    Encounter for gynecological examination without abnormal finding    -  Primary   Breast screening       Relevant Orders   MS DIGITAL SCREENING TOMO BILATERAL      1) Mammogram - recommend yearly screening mammogram.  Mammogram Was ordered today  2) STI screening  was notoffered and therefore not obtained  3) ASCCP guidelines and rational discussed.  Patient opts for every 3 years screening interval  4) Osteoporosis  -  Last DEXA 2020 stable - repeat DEXA 2022  5) Routine healthcare maintenance including cholesterol, diabetes screening discussed managed by PCP  6) Colonoscopy - 01/11/2018 [facility] normal  7) Incontinence to flatulance - urogynecology vs colorectal surgery referral (preferrs [facility])  does have small perineal body with poor sphinter tone possibel defect around 8 Oclock  8)  Return in about 1 year (around 12/08/2020) for annual.    Malachy Mood, MD [place] Pigeon, [facility] 12/09/2019, 1:30 PM

## 2019-12-11 DIAGNOSIS — R519 Headache, unspecified: Secondary | ICD-10-CM | POA: Insufficient documentation

## 2019-12-24 ENCOUNTER — Other Ambulatory Visit: Payer: Self-pay

## 2019-12-24 ENCOUNTER — Ambulatory Visit: Payer: BC Managed Care – PPO | Attending: Internal Medicine

## 2019-12-24 DIAGNOSIS — Z23 Encounter for immunization: Secondary | ICD-10-CM | POA: Insufficient documentation

## 2019-12-24 NOTE — Progress Notes (Signed)
   Covid-19 Vaccination Clinic  Name:  Janet Campos    MRN: [id number] DOB: Apr 30, 1961  12/24/2019  [name] was observed post Covid-19 immunization for 15 minutes without incident. She was provided with Vaccine Information Sheet and instruction to access the V-Safe system.   [name] was instructed to call 911 with any severe reactions post vaccine: Marland Kitchen Difficulty breathing  . Swelling of face and throat  . A fast heartbeat  . A bad rash all over body  . Dizziness and weakness   Immunizations Administered    Name Date Dose VIS Date Route   Moderna COVID-19 Vaccine 12/24/2019  3:17 PM 0.5 mL 09/24/2019 Intramuscular   Manufacturer: Moderna   Lot: [id number]   [id number]

## 2020-01-21 ENCOUNTER — Ambulatory Visit: Payer: BC Managed Care – PPO | Attending: Internal Medicine

## 2020-01-21 ENCOUNTER — Ambulatory Visit: Payer: BC Managed Care – PPO

## 2020-01-21 DIAGNOSIS — Z23 Encounter for immunization: Secondary | ICD-10-CM

## 2020-01-21 NOTE — Progress Notes (Signed)
   Covid-19 Vaccination Clinic  Name:  Janet Campos    MRN: [id number] DOB: 1961-08-15  01/21/2020  [name] was observed post Covid-19 immunization for 15 minutes without incident. She was provided with Vaccine Information Sheet and instruction to access the V-Safe system.   [name] was instructed to call 911 with any severe reactions post vaccine: Marland Kitchen Difficulty breathing  . Swelling of face and throat  . A fast heartbeat  . A bad rash all over body  . Dizziness and weakness   Immunizations Administered    Name Date Dose VIS Date Route   Moderna COVID-19 Vaccine 01/21/2020  4:00 PM 0.5 mL 09/24/2019 Intramuscular   Manufacturer: Moderna   Lot: [id number]   [id number]

## 2020-02-24 ENCOUNTER — Ambulatory Visit
Admission: RE | Admit: 2020-02-24 | Discharge: 2020-02-24 | Disposition: A | Discharge status: Home / Self Care | Payer: BC Managed Care – PPO | Source: Ambulatory Visit | Attending: Obstetrics and Gynecology | Admitting: Obstetrics and Gynecology

## 2020-02-24 DIAGNOSIS — Z1231 Encounter for screening mammogram for malignant neoplasm of breast: Secondary | ICD-10-CM | POA: Insufficient documentation

## 2020-02-24 DIAGNOSIS — Z1239 Encounter for other screening for malignant neoplasm of breast: Secondary | ICD-10-CM

## 2020-06-01 ENCOUNTER — Other Ambulatory Visit: Payer: Self-pay

## 2020-06-01 ENCOUNTER — Other Ambulatory Visit: Payer: Self-pay | Admitting: Obstetrics and Gynecology

## 2020-06-01 MED ORDER — VALACYCLOVIR HCL 1 G PO TABS: 1000.0000 mg | ORAL_TABLET | Freq: Every day | ORAL | 6 refills | Status: DC

## 2020-11-16 ENCOUNTER — Encounter: Payer: Self-pay | Admitting: Dermatology

## 2020-11-16 ENCOUNTER — Ambulatory Visit: Payer: BC Managed Care – PPO | Admitting: Dermatology

## 2020-11-16 ENCOUNTER — Other Ambulatory Visit: Payer: Self-pay

## 2020-11-16 DIAGNOSIS — L700 Acne vulgaris: Secondary | ICD-10-CM

## 2020-11-16 DIAGNOSIS — D229 Melanocytic nevi, unspecified: Secondary | ICD-10-CM

## 2020-11-16 DIAGNOSIS — D2222 Melanocytic nevi of left ear and external auricular canal: Secondary | ICD-10-CM

## 2020-11-16 DIAGNOSIS — L821 Other seborrheic keratosis: Secondary | ICD-10-CM

## 2020-11-16 DIAGNOSIS — D2372 Other benign neoplasm of skin of left lower limb, including hip: Secondary | ICD-10-CM | POA: Diagnosis not present

## 2020-11-16 DIAGNOSIS — D239 Other benign neoplasm of skin, unspecified: Secondary | ICD-10-CM

## 2020-11-16 DIAGNOSIS — D489 Neoplasm of uncertain behavior, unspecified: Secondary | ICD-10-CM

## 2020-11-16 DIAGNOSIS — L409 Psoriasis, unspecified: Secondary | ICD-10-CM

## 2020-11-16 DIAGNOSIS — L578 Other skin changes due to chronic exposure to nonionizing radiation: Secondary | ICD-10-CM

## 2020-11-16 DIAGNOSIS — D1800 Hemangioma unspecified site: Secondary | ICD-10-CM

## 2020-11-16 DIAGNOSIS — L814 Other melanin hyperpigmentation: Secondary | ICD-10-CM

## 2020-11-16 DIAGNOSIS — Z86018 Personal history of other benign neoplasm: Secondary | ICD-10-CM

## 2020-11-16 DIAGNOSIS — Z1283 Encounter for screening for malignant neoplasm of skin: Secondary | ICD-10-CM | POA: Diagnosis not present

## 2020-11-16 MED ORDER — CLINDAMYCIN-TRETINOIN 1.2-0.025 % EX GEL: Freq: Every day | CUTANEOUS | 1 refills | Status: DC

## 2020-11-16 MED ORDER — MOMETASONE FUROATE 0.1 % EX SOLN: Freq: Every day | CUTANEOUS | 1 refills | Status: DC

## 2020-11-16 NOTE — Patient Instructions (Addendum)
Melanoma ABCDEs  Melanoma is the most dangerous type of skin cancer, and is the leading cause of death from skin disease.  You are more likely to develop melanoma if you:  Have light-colored skin, light-colored eyes, or red or blond hair  Spend a lot of time in the sun  Tan regularly, either outdoors or in a tanning bed  Have had blistering sunburns, especially during childhood  Have a close family member who has had a melanoma  Have atypical moles or large birthmarks  Early detection of melanoma is key since treatment is typically straightforward and cure rates are extremely high if we catch it early.   The first sign of melanoma is often a change in a mole or a new dark spot.  The ABCDE system is a way of remembering the signs of melanoma.  A for asymmetry:  The two halves do not match. B for border:  The edges of the growth are irregular. C for color:  A mixture of colors are present instead of an even brown color. D for diameter:  Melanomas are usually (but not always) greater than 38mm - the size of a pencil eraser. E for evolution:  The spot keeps changing in size, shape, and color.  Please check your skin once per month between visits. You can use a small mirror in front and a large mirror behind you to keep an eye on the back side or your body.   If you see any new or changing lesions before your next follow-up, please call to schedule a visit.  Please continue daily skin protection including broad spectrum sunscreen SPF 30+ to sun-exposed areas, reapplying every 2 hours as needed when you're outdoors.   Topical steroids (such as triamcinolone, fluocinolone, fluocinonide, mometasone, clobetasol, halobetasol, betamethasone, hydrocortisone) can cause thinning and lightening of the skin if they are used for too long in the same area. Your physician has selected the right strength medicine for your problem and area affected on the body. Please use your medication only as directed  by your physician to prevent side effects.   Wound Care Instructions  1. Cleanse wound gently with soap and water once a day then pat dry with clean gauze. Apply a thing coat of Petrolatum (petroleum jelly, "Vaseline") over the wound (unless you have an allergy to this). We recommend that you use a new, sterile tube of Vaseline. Do not pick or remove scabs. Do not remove the yellow or white "healing tissue" from the base of the wound.  2. Cover the wound with fresh, clean, nonstick gauze and secure with paper tape. You may use Band-Aids in place of gauze and tape if the would is small enough, but would recommend trimming much of the tape off as there is often too much. Sometimes Band-Aids can irritate the skin.  3. You should call the office for your biopsy report after 1 week if you have not already been contacted.  4. If you experience any problems, such as abnormal amounts of bleeding, swelling, significant bruising, significant pain, or evidence of infection, please call the office immediately.  5. FOR ADULT SURGERY PATIENTS: If you need something for pain relief you may take 1 extra strength Tylenol (acetaminophen) AND 2 Ibuprofen (200mg  each) together every 4 hours as needed for pain. (do not take these if you are allergic to them or if you have a reason you should not take them.) Typically, you may only need pain medication for 1 to 3 days.  Topical retinoid medications like tretinoin/Retin-A, adapalene/Differin, tazarotene/Fabior, and Epiduo/Epiduo Forte can cause dryness and irritation when first started. Only apply a pea-sized amount to the entire affected area. Avoid applying it around the eyes, edges of mouth and creases at the nose. If you experience irritation, use a good moisturizer first and/or apply the medicine less often. If you are doing well with the medicine, you can increase how often you use it until you are applying every night. Be careful with sun protection while using  this medication as it can make you sensitive to the sun. This medicine should not be used by pregnant women.

## 2020-11-16 NOTE — Progress Notes (Unsigned)
Follow-Up Visit   Subjective  Janet Campos is a 60 y.o. female who presents for the following: Annual Exam (Patient here for full body skin exam and skin cancer screening. Patient with no history of skin cancer but she has had dysplastic nevus. She is using Veltin for her acne which is controlled. 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 She uses mometasone solution and T-Gel shampoo. /). The patient presents for Total-Body Skin Exam (TBSE) for skin cancer screening and mole check.  Patient would like refills on Veltin and mometasone.   The following portions of the chart were reviewed this encounter and updated as appropriate:   Tobacco  Allergies  Meds  Problems  Med Hx  Surg Hx  Fam Hx     Review of Systems:  No other skin or systemic complaints except as noted in HPI or Assessment and Plan.  Objective  Well appearing patient in no apparent distress; mood and affect are within normal limits.  A full examination was performed including scalp, head, eyes, ears, nose, lips, neck, chest, axillae, abdomen, back, buttocks, bilateral upper extremities, bilateral lower extremities, hands, feet, fingers, toes, fingernails, and toenails. All findings within normal limits unless otherwise noted below.  Objective  Scalp: Scaliness at scalp   Objective  Left Posterior Ear: 0.4cm brown macule     Objective  Face: [place]   Objective  Left Knee: Firm pink/brown papulenodule with dimple sign.    Assessment & Plan  Psoriasis Scalp Not to goal. Continue mometasone solution daily as needed for itch and scale. Avoid applying to face, groin, and axilla. Use as directed. Risk of skin atrophy with long-term use reviewed.   Topical steroids (such as triamcinolone, fluocinolone, fluocinonide, mometasone, clobetasol, halobetasol, betamethasone, hydrocortisone) can cause thinning and lightening of the skin if they are used for too long in the same area. Your physician  has selected the right strength medicine for your problem and area affected on the body. Please use your medication only as directed by your physician to prevent side effects.   Psoriasis is a chronic non-curable, but treatable genetic/hereditary disease that may have other systemic features affecting other organ systems such as joints (Psoriatic Arthritis). It is associated with an increased risk of inflammatory bowel disease, heart disease, non-alcoholic fatty liver disease, and depression.    Ordered Medications: mometasone (ELOCON) 0.1 % lotion  Neoplasm of uncertain behavior Left Posterior Ear  Epidermal / dermal shaving  Lesion diameter (cm):  0.4 Informed consent: discussed and consent obtained   Timeout: patient name, date of birth, surgical site, and procedure verified   Procedure prep:  Patient was prepped and draped in usual sterile fashion Prep type:  Isopropyl alcohol Anesthesia: the lesion was anesthetized in a standard fashion   Anesthetic:  1% lidocaine w/ epinephrine 1-100,000 buffered w/ 8.4% NaHCO3 Instrument used: flexible razor blade   Hemostasis achieved with: pressure, aluminum chloride and electrodesiccation   Outcome: patient tolerated procedure well   Post-procedure details: sterile dressing applied and wound care instructions given   Dressing type: bandage and petrolatum    Specimen 1 - Surgical pathology Differential Diagnosis: Nevus vs Dysplastic Nevus  Check Margins: No 0.4cm brown macule  Acne vulgaris Face Chronic; persistent.  Not to goal Continue Veltin gel qhs.  Topical retinoid medications like tretinoin/Retin-A, adapalene/Differin, tazarotene/Fabior, and Epiduo/Epiduo Forte can cause dryness and irritation when first started. Only apply a pea-sized amount to the entire affected area. Avoid applying it around the eyes, edges of  mouth and creases at the nose. If you experience irritation, use a good moisturizer first and/or apply the medicine less  often. If you are doing well with the medicine, you can increase how often you use it until you are applying every night. Be careful with sun protection while using this medication as it can make you sensitive to the sun. This medicine should not be used by pregnant women.   Ordered Medications: clindamycin-tretinoin (VELTIN) gel  Dermatofibroma Left Knee Benign-appearing.  Observation.  Call clinic for new or changing lesions.  Recommend daily use of broad spectrum spf 30+ sunscreen to sun-exposed areas.   Lentigines - Scattered tan macules - Discussed due to sun exposure - Benign, observe - Call for any changes  Seborrheic Keratoses - Stuck-on, waxy, tan-brown papules and plaques  - Discussed benign etiology and prognosis. - Observe - Call for any changes  Melanocytic Nevi - Tan-brown and/or pink-flesh-colored symmetric macules and papules - Benign appearing on exam today - Observation - Call clinic for new or changing moles - Recommend daily use of broad spectrum spf 30+ sunscreen to sun-exposed areas.   Hemangiomas - Red papules - Discussed benign nature - Observe - Call for any changes  Actinic Damage - Chronic, secondary to cumulative UV/sun exposure - diffuse scaly erythematous macules with underlying dyspigmentation - Recommend daily broad spectrum sunscreen SPF 30+ to sun-exposed areas, reapply every 2 hours as needed.  - Call for new or changing lesions.  Skin cancer screening performed today.  History of Dysplastic Nevi - No evidence of recurrence today at right lateral to crown scalp/moderate - Recommend regular full body skin exams - Recommend daily broad spectrum sunscreen SPF 30+ to sun-exposed areas, reapply every 2 hours as needed.  - Call if any new or changing lesions are noted between office visits  Return in about 1 year (around 11/16/2021) for TBSE.  Graciella Belton, RMA, am acting as scribe for Sarina Ser, MD . Documentation: I have  reviewed the above documentation for accuracy and completeness, and I agree with the above.  Sarina Ser, MD

## 2020-11-17 ENCOUNTER — Encounter: Payer: Self-pay | Admitting: Dermatology

## 2020-11-19 ENCOUNTER — Telehealth: Payer: Self-pay

## 2020-11-19 NOTE — Telephone Encounter (Signed)
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 

## 2020-11-19 NOTE — Telephone Encounter (Signed)
Unable to leave a message regarding pathology results.

## 2020-11-24 ENCOUNTER — Telehealth: Payer: Self-pay

## 2020-11-24 NOTE — Telephone Encounter (Signed)
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

## 2020-11-24 NOTE — Telephone Encounter (Signed)
Left message for patient to call office/hd 

## 2020-11-24 NOTE — Telephone Encounter (Signed)
Pt informed of results. She had no concerns. 

## 2020-12-14 ENCOUNTER — Ambulatory Visit (INDEPENDENT_AMBULATORY_CARE_PROVIDER_SITE_OTHER): Payer: BC Managed Care – PPO | Admitting: Obstetrics and Gynecology

## 2020-12-14 ENCOUNTER — Other Ambulatory Visit: Payer: Self-pay

## 2020-12-14 ENCOUNTER — Encounter: Payer: Self-pay | Admitting: Obstetrics and Gynecology

## 2020-12-14 ENCOUNTER — Other Ambulatory Visit (HOSPITAL_COMMUNITY)
Admission: RE | Admit: 2020-12-14 | Discharge: 2020-12-14 | Disposition: A | Discharge status: Home / Self Care | Payer: BC Managed Care – PPO | Source: Ambulatory Visit | Attending: Obstetrics and Gynecology | Admitting: Obstetrics and Gynecology

## 2020-12-14 VITALS — BP 128/72 | Ht 63.0 in | Wt 135.0 lb

## 2020-12-14 DIAGNOSIS — Z1239 Encounter for other screening for malignant neoplasm of breast: Secondary | ICD-10-CM | POA: Diagnosis not present

## 2020-12-14 DIAGNOSIS — Z124 Encounter for screening for malignant neoplasm of cervix: Secondary | ICD-10-CM

## 2020-12-14 DIAGNOSIS — R350 Frequency of micturition: Secondary | ICD-10-CM | POA: Diagnosis not present

## 2020-12-14 DIAGNOSIS — B356 Tinea cruris: Secondary | ICD-10-CM

## 2020-12-14 DIAGNOSIS — Z01419 Encounter for gynecological examination (general) (routine) without abnormal findings: Secondary | ICD-10-CM | POA: Diagnosis not present

## 2020-12-14 LAB — POCT URINALYSIS DIPSTICK
Bilirubin, UA: NEGATIVE
Blood, UA: NEGATIVE
Glucose, UA: NEGATIVE
Ketones, UA: NEGATIVE
Nitrite, UA: NEGATIVE
Protein, UA: NEGATIVE
Spec Grav, UA: 1.01 (ref 1.010–1.025)
Urobilinogen, UA: 0.2 E.U./dL
pH, UA: 7 (ref 5.0–8.0)

## 2020-12-14 MED ORDER — NYSTATIN 100000 UNIT/GM EX POWD: 1.0000 "application " | Freq: Three times a day (TID) | CUTANEOUS | 0 refills | Status: DC

## 2020-12-14 NOTE — Patient Instructions (Signed)
[facility] [address] [place] [address]  [facility] [place]  [address] [place] [address]  Phone: [phone]  

## 2020-12-14 NOTE — Progress Notes (Signed)
Gynecology Annual Exam  PCP: Kirk Ruths, MD  Chief Complaint:  Chief Complaint  Patient presents with  . Gynecologic Exam    Annual - Rx Lotrisone cream, ? UTI. RM 5    History of Present Illness:Patient is a 60 y.o. G2P2002 presents for annual exam. The patient has no complaints today.   LMP: No LMP recorded. Patient is postmenopausal. No PMB  She denies dyspareunia.  The patient does perform self breast exams.  There is no notable family history of breast or ovarian cancer in her family.  The patient wears seatbelts: yes.   The patient has regular exercise: not asked.    The patient denies current symptoms of depression.     History of HSV infrequent outbreaks but has noted some irritation and pruritus in the left groin crease  Review of Systems: Review of Systems  Constitutional: Negative for chills and fever.  HENT: Negative for congestion.   Respiratory: Negative for cough and shortness of breath.   Cardiovascular: Negative for chest pain and palpitations.  Gastrointestinal: Negative for abdominal pain, constipation, diarrhea, heartburn, nausea and vomiting.  Genitourinary: Negative for dysuria, frequency and urgency.  Skin: Negative for itching and rash.  Neurological: Negative for dizziness and headaches.  Endo/Heme/Allergies: Negative for polydipsia.  Psychiatric/Behavioral: Negative for depression.    Past Medical History:  There are no problems to display for this patient.   Past Surgical History:  Past Surgical History:  Procedure Laterality Date  . COLONOSCOPY    . COLPOSCOPY  2004  . DILATION AND CURETTAGE OF UTERUS    . TUBAL LIGATION  1989  . VARICOSE VEIN SURGERY    . vulvar hematoma      Gynecologic History:  No LMP recorded. Patient is postmenopausal. Last Pap: Results were: 09/13/2017 NIL and HR HPV negative  Last mammogram:02/24/2020 Results were: BI-RAD I  Obstetric History: [id number]  Family History:  Family History   Problem Relation Age of Onset  . Breast cancer Paternal Aunt 40  . Breast cancer Maternal Grandmother 80  . Diabetes Mother   . Hypertension Mother   . Prostate cancer Father   . Colon cancer Paternal Aunt     Social History:  Social History   Socioeconomic History  . Marital status: Widowed    Spouse name: Not on file  . Number of children: Not on file  . Years of education: Not on file  . Highest education level: Not on file  Occupational History  . Not on file  Tobacco Use  . Smoking status: Never Smoker  . Smokeless tobacco: Never Used  Vaping Use  . Vaping Use: Never used  Substance and Sexual Activity  . Alcohol use: No  . Drug use: No  . Sexual activity: Yes    Partners: Male    Birth control/protection: Post-menopausal  Other Topics Concern  . Not on file  Social History Narrative  . Not on file   Social Determinants of Health   Financial Resource Strain: Not on file  Food Insecurity: Not on file  Transportation Needs: Not on file  Physical Activity: Not on file  Stress: Not on file  Social Connections: Not on file  Intimate Partner Violence: Not on file    Allergies:  Allergies  Allergen Reactions  . Metronidazole Nausea Only    Medications: Prior to Admission medications   Medication Sig Start Date End Date Taking? Authorizing Provider  azelastine (ASTELIN) 0.1 % nasal spray Place into the nose.  Yes [provider]  Calcium Carbonate-Vitamin D 600-400 MG-UNIT tablet Take by mouth.   Yes [provider]  Cholecalciferol 25 MCG (1000 UT) tablet Take by mouth.   Yes [provider]  clindamycin-tretinoin (VELTIN) gel Apply topically at bedtime. 11/16/20  Yes Ralene Bathe, MD  clotrimazole-betamethasone (LOTRISONE) cream Apply 1 application topically 2 (two) times daily. 11/05/18  Yes Gae Dry, MD  dicyclomine (BENTYL) 10 MG capsule Take 1 capsule by mouth four times a day before meals as needed for pain  06/28/17  Yes [provider]  ezetimibe (ZETIA) 10 MG tablet TAKE 1 TABLET(10 MG) BY MOUTH EVERY DAY 06/03/19  Yes [provider]  mometasone (ELOCON) 0.1 % lotion Apply topically daily. Avoid applying to face, groin, and axilla. Use as directed. Risk of skin atrophy with long-term use reviewed. 11/16/20  Yes Ralene Bathe, MD  Multiple Vitamin (MULTI-VITAMIN) tablet Take by mouth.   Yes [provider]  pravastatin (PRAVACHOL) 10 MG tablet  09/21/19  Yes [provider]  clindamycin-tretinoin (ZIANA) gel APPLY A THIN LAYER TO [place] AT BEDTIME Patient not taking: Reported on 12/14/2020 11/01/17   [provider]  conjugated estrogens (PREMARIN) vaginal cream PLACE 1 APPLICATORFUL  VAGINALLY TWICE A WEEK Patient not taking: Reported on 11/16/2020 09/15/17   [provider]  mometasone (ELOCON) 0.1 % lotion APPLY 1 MILLILITER TO THE AFFECTED AREA OF THE SKIN ONCE DAILY AS NEEDED Patient not taking: Reported on 12/14/2020 10/30/17   [provider]  omeprazole (PRILOSEC OTC) 20 MG tablet Take by mouth. Patient not taking: Reported on 12/14/2020    [provider]  Pramoxine-HC 1-2.5 % OINT Apply topically. Patient not taking: Reported on 12/14/2020    [provider]    Physical Exam Vitals: Blood pressure 128/72, height 5\' 3"  (1.6 m), weight 135 lb (61.2 kg).  General: NAD HEENT: normocephalic, anicteric Thyroid: no enlargement, no palpable nodules Pulmonary: No increased work of breathing, CTAB Cardiovascular: RRR, distal pulses 2+ Breast: Breast symmetrical, no tenderness, no palpable nodules or masses, no skin or nipple retraction present, no nipple discharge.  No axillary or supraclavicular lymphadenopathy. Abdomen: NABS, soft, non-tender, non-distended.  Umbilicus without lesions.  No hepatomegaly, splenomegaly or masses palpable. No evidence of hernia  Genitourinary:  External: Normal external female  genitalia.  Normal urethral meatus, normal Bartholin's and Skene's glands.    Vagina: Normal vaginal mucosa, no evidence of prolapse.    Cervix: Grossly normal in appearance, no bleeding  Uterus: Non-enlarged, mobile, normal contour.  No CMT  Adnexa: ovaries non-enlarged, no adnexal masses  Rectal: deferred  Lymphatic: no evidence of inguinal lymphadenopathy Extremities: no edema, erythema, or tenderness Neurologic: Grossly intact Psychiatric: mood appropriate, affect full  Female chaperone present for pelvic and breast  portions of the physical exam     Assessment: 60 y.o. G2P2002 routine annual exam  Plan: Problem List Items Addressed This Visit   None   Visit Diagnoses    Frequency of urination    -  Primary   Relevant Orders   POCT Urinalysis Dipstick (Completed)   Encounter for gynecological examination without abnormal finding       Breast screening       Relevant Orders   MM 3D SCREEN BREAST BILATERAL   Screening for malignant neoplasm of cervix       Relevant Orders   Cytology - PAP   Tinea cruris       Relevant Medications  nystatin (MYCOSTATIN/NYSTOP) powder      1) Mammogram - recommend yearly screening mammogram.  Mammogram Was ordered today  2) STI screening  was notoffered and therefore not obtained  3) ASCCP guidelines and rational discussed.  Patient opts for every 3 years screening interval  4) Osteoporosis  - per USPTF routine screening DEXA at age 13  5) Routine healthcare maintenance including cholesterol, diabetes screening discussed managed by PCP  6) Colonoscopy 01/11/2018 - [facility]  7) Tinea Cruris - RX nsytatin powder  8) Return in about 1 year (around 12/14/2021) for annual.    Malachy Mood, MD [place], [facility] 12/14/2020, 11:17 AM

## 2020-12-15 ENCOUNTER — Other Ambulatory Visit: Payer: Self-pay

## 2020-12-15 ENCOUNTER — Other Ambulatory Visit: Payer: Self-pay | Admitting: Obstetrics and Gynecology

## 2020-12-15 DIAGNOSIS — R300 Dysuria: Secondary | ICD-10-CM

## 2020-12-15 DIAGNOSIS — N390 Urinary tract infection, site not specified: Secondary | ICD-10-CM

## 2020-12-16 LAB — CYTOLOGY - PAP
Adequacy: ABSENT
Comment: NEGATIVE
Diagnosis: NEGATIVE
High risk HPV: NEGATIVE

## 2020-12-17 LAB — URINE CULTURE: Organism ID, Bacteria: NO GROWTH

## 2021-03-01 ENCOUNTER — Other Ambulatory Visit: Payer: Self-pay

## 2021-03-01 ENCOUNTER — Ambulatory Visit
Admission: RE | Admit: 2021-03-01 | Discharge: 2021-03-01 | Disposition: A | Discharge status: Home / Self Care | Payer: BC Managed Care – PPO | Source: Ambulatory Visit | Attending: Obstetrics and Gynecology | Admitting: Obstetrics and Gynecology

## 2021-03-01 DIAGNOSIS — Z1231 Encounter for screening mammogram for malignant neoplasm of breast: Secondary | ICD-10-CM | POA: Insufficient documentation

## 2021-03-01 DIAGNOSIS — Z1239 Encounter for other screening for malignant neoplasm of breast: Secondary | ICD-10-CM

## 2021-07-13 ENCOUNTER — Other Ambulatory Visit: Payer: Self-pay | Admitting: Internal Medicine

## 2021-07-13 ENCOUNTER — Other Ambulatory Visit (HOSPITAL_COMMUNITY): Payer: Self-pay | Admitting: Internal Medicine

## 2021-07-13 ENCOUNTER — Ambulatory Visit
Admission: RE | Admit: 2021-07-13 | Discharge: 2021-07-13 | Disposition: A | Discharge status: Home / Self Care | Payer: BC Managed Care – PPO | Source: Ambulatory Visit | Attending: Internal Medicine | Admitting: Internal Medicine

## 2021-07-13 DIAGNOSIS — R1012 Left upper quadrant pain: Secondary | ICD-10-CM

## 2021-08-04 IMAGING — MG MM DIGITAL SCREENING BILAT W/ TOMO AND CAD
8 series · 8 of 24 positions shown · non-contrast
Comparison: Previous exam(s).

CLINICAL DATA: Screening.

EXAM:
DIGITAL SCREENING BILATERAL MAMMOGRAM WITH TOMOSYNTHESIS AND CAD
TECHNIQUE: Bilateral screening digital craniocaudal and mediolateral oblique
mammograms were obtained. Bilateral screening digital breast
tomosynthesis was performed. The images were evaluated with
computer-aided detection.

[R MLO synth-2D]
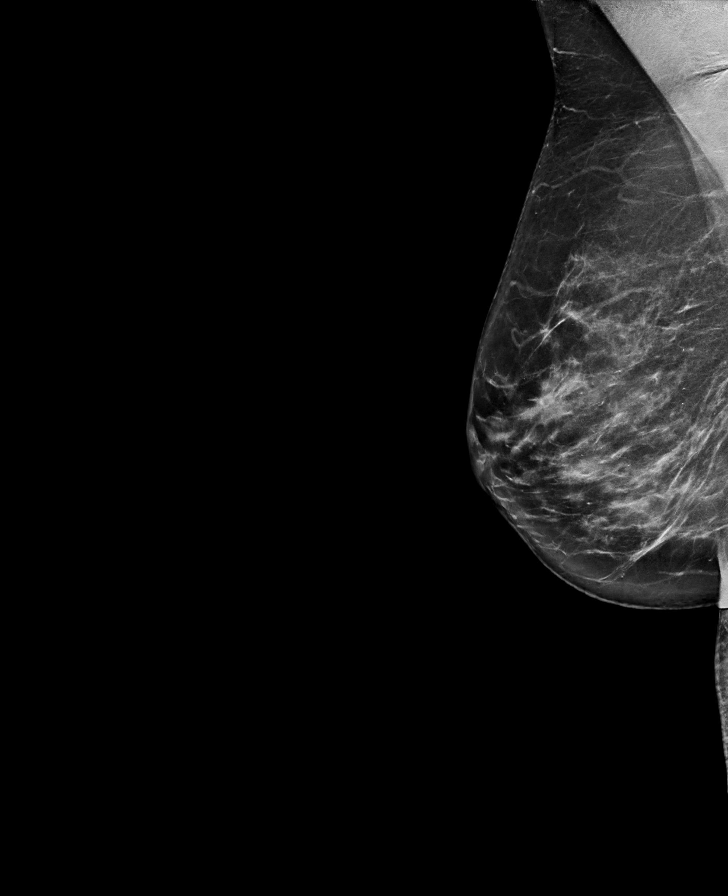

[L MLO synth-2D]
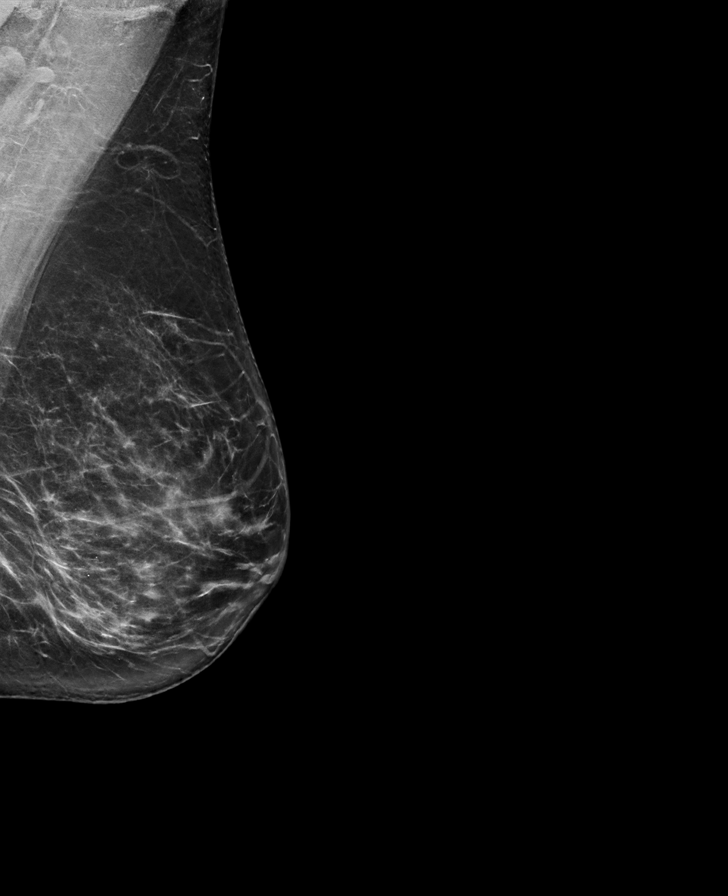

[L CC synth-2D]
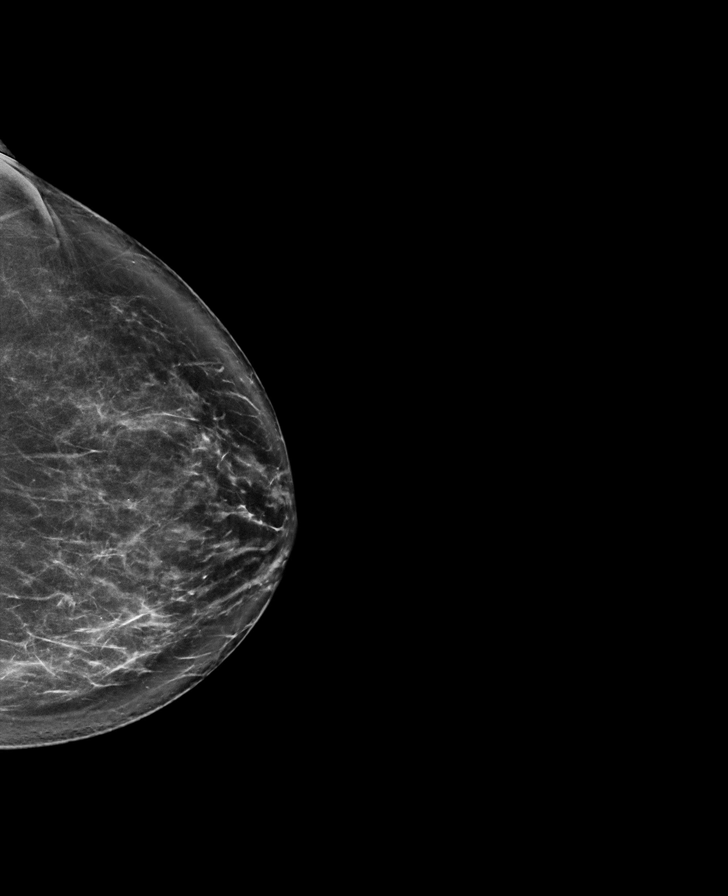

[R CC synth-2D]
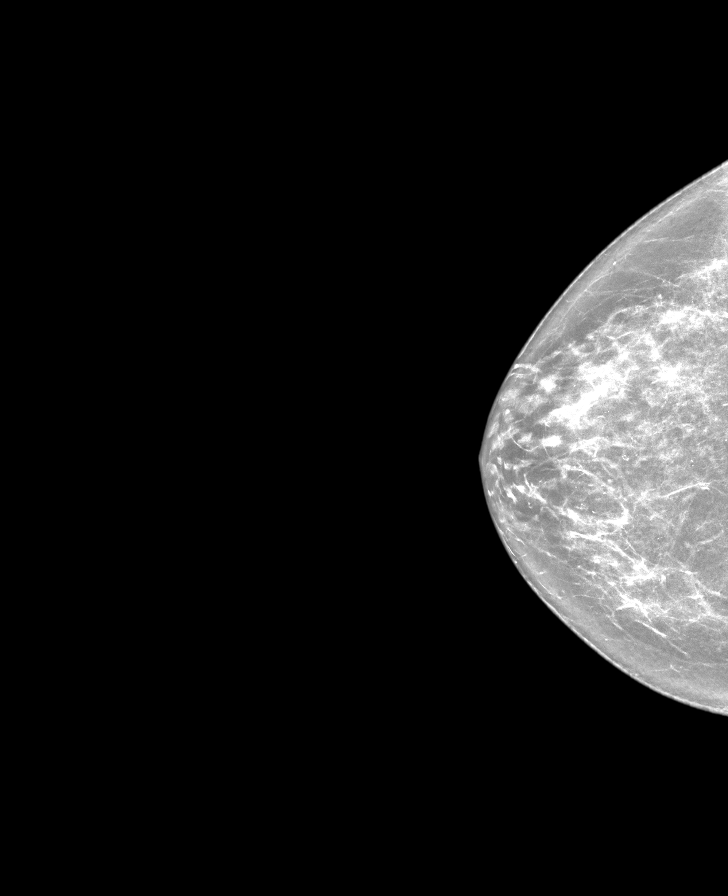

[R MLO tomo · tomo slice 40/79.0]
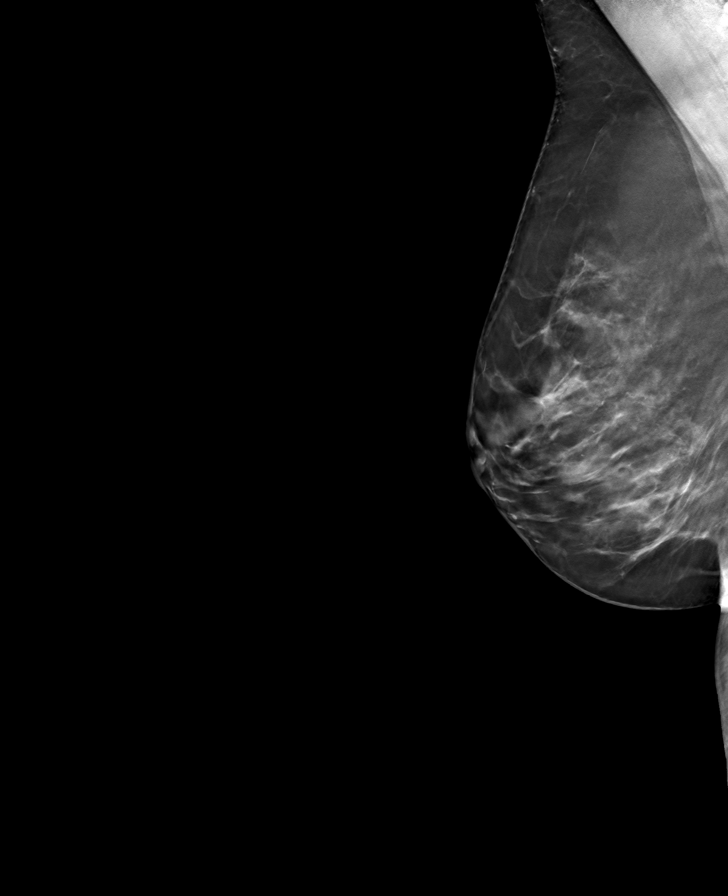

[L CC tomo · tomo slice 40/79.0]
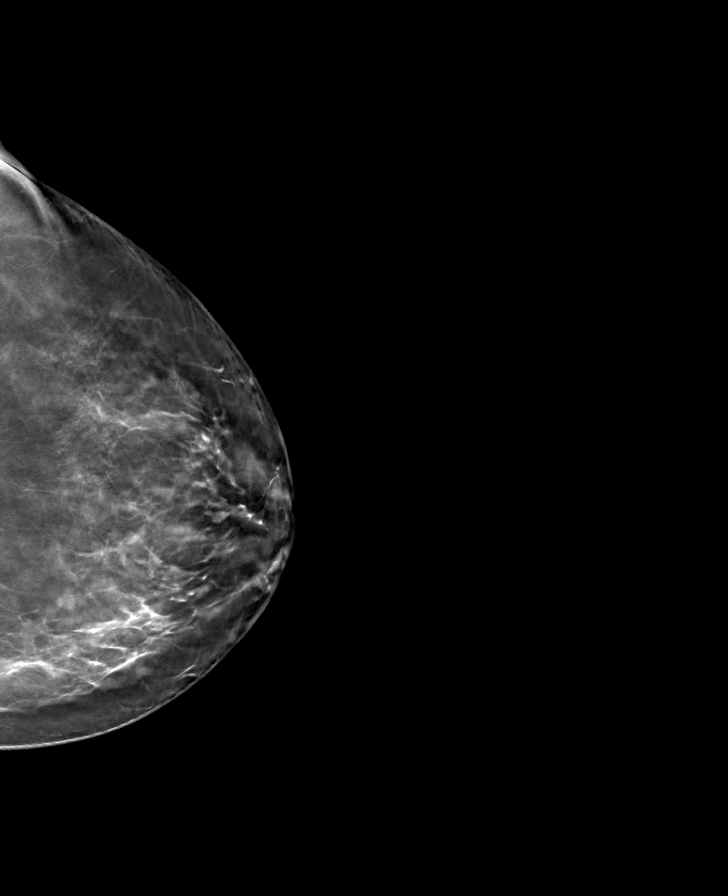

[R CC tomo · tomo slice 35/70.0]
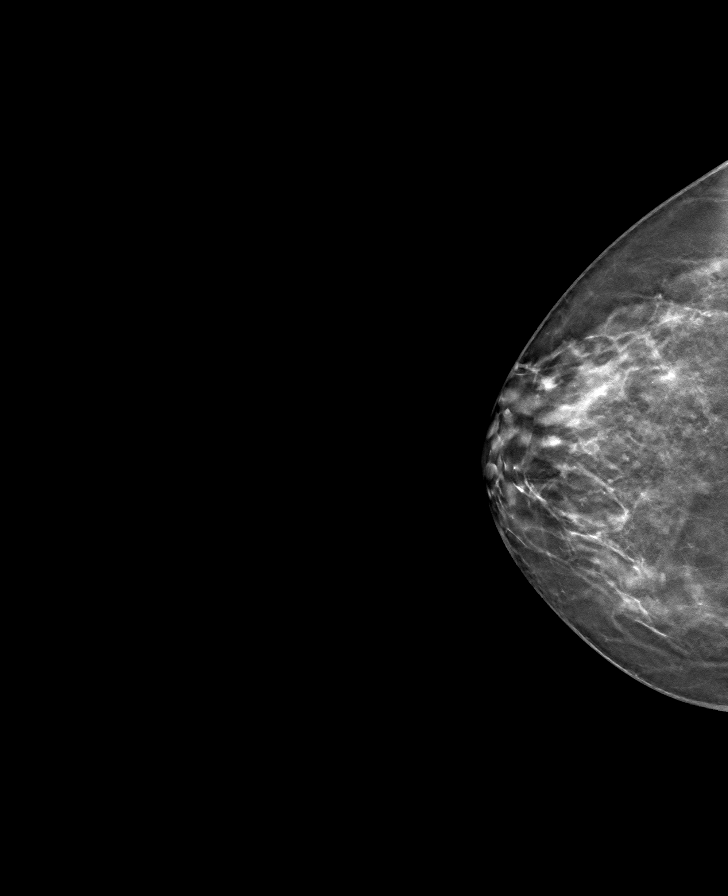

[L MLO tomo · tomo slice 40/79.0]
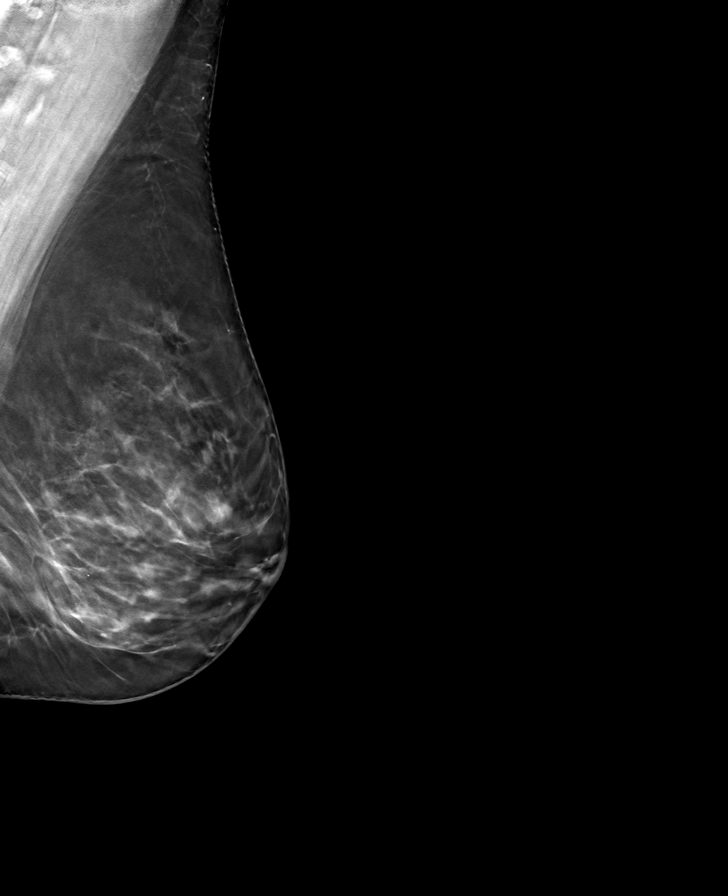

[8 of 24 positions shown; findings below may reference images not displayed]

ACR Breast Density Category c: The breast tissue is heterogeneously
dense, which may obscure small masses.
FINDINGS: There are no findings suspicious for malignancy. The images were
evaluated with computer-aided detection.
IMPRESSION: No mammographic evidence of malignancy. A result letter of this
screening mammogram will be mailed directly to the patient.

RECOMMENDATION:
Screening mammogram in one year. (Code:T4-5-GWO)

BI-RADS CATEGORY  1: Negative.

## 2021-10-20 ENCOUNTER — Encounter: Payer: Self-pay | Admitting: Obstetrics and Gynecology

## 2021-10-21 ENCOUNTER — Other Ambulatory Visit: Payer: Self-pay | Admitting: Obstetrics and Gynecology

## 2021-10-21 MED ORDER — VALACYCLOVIR HCL 1 G PO TABS: 1000.0000 mg | ORAL_TABLET | Freq: Every day | ORAL | 6 refills | Status: AC

## 2021-11-18 ENCOUNTER — Other Ambulatory Visit: Payer: Self-pay

## 2021-11-18 ENCOUNTER — Ambulatory Visit: Payer: BC Managed Care – PPO | Admitting: Dermatology

## 2021-11-18 ENCOUNTER — Other Ambulatory Visit: Payer: Self-pay | Admitting: Dermatology

## 2021-11-18 DIAGNOSIS — L821 Other seborrheic keratosis: Secondary | ICD-10-CM

## 2021-11-18 DIAGNOSIS — Z1283 Encounter for screening for malignant neoplasm of skin: Secondary | ICD-10-CM

## 2021-11-18 DIAGNOSIS — D229 Melanocytic nevi, unspecified: Secondary | ICD-10-CM

## 2021-11-18 DIAGNOSIS — L858 Other specified epidermal thickening: Secondary | ICD-10-CM

## 2021-11-18 DIAGNOSIS — L700 Acne vulgaris: Secondary | ICD-10-CM | POA: Diagnosis not present

## 2021-11-18 DIAGNOSIS — Z86018 Personal history of other benign neoplasm: Secondary | ICD-10-CM

## 2021-11-18 DIAGNOSIS — D2222 Melanocytic nevi of left ear and external auricular canal: Secondary | ICD-10-CM | POA: Diagnosis not present

## 2021-11-18 DIAGNOSIS — L738 Other specified follicular disorders: Secondary | ICD-10-CM | POA: Diagnosis not present

## 2021-11-18 DIAGNOSIS — D239 Other benign neoplasm of skin, unspecified: Secondary | ICD-10-CM

## 2021-11-18 DIAGNOSIS — L409 Psoriasis, unspecified: Secondary | ICD-10-CM

## 2021-11-18 DIAGNOSIS — D2372 Other benign neoplasm of skin of left lower limb, including hip: Secondary | ICD-10-CM

## 2021-11-18 DIAGNOSIS — L814 Other melanin hyperpigmentation: Secondary | ICD-10-CM

## 2021-11-18 DIAGNOSIS — K130 Diseases of lips: Secondary | ICD-10-CM

## 2021-11-18 DIAGNOSIS — D1800 Hemangioma unspecified site: Secondary | ICD-10-CM

## 2021-11-18 MED ORDER — CLINDAMYCIN-TRETINOIN 1.2-0.025 % EX GEL: Freq: Every day | CUTANEOUS | 6 refills | Status: DC

## 2021-11-18 MED ORDER — TACROLIMUS 0.1 % EX OINT
TOPICAL_OINTMENT | CUTANEOUS | 1 refills | Status: DC
Start: 2021-11-18 — End: 2022-06-06

## 2021-11-18 MED ORDER — MOMETASONE FUROATE 0.1 % EX SOLN: Freq: Every day | CUTANEOUS | 6 refills | Status: DC

## 2021-11-18 NOTE — Progress Notes (Signed)
Follow-Up Visit   Subjective  Janet Campos is a 61 y.o. female who presents for the following: Annual Exam (Mole check ). Refill mometasone solution for psoriasis on the scalp, refill Clindamycin-tretinoin for acne.  The patient presents for Total-Body Skin Exam (TBSE) for skin cancer screening and mole check.  The patient has spots, moles and lesions to be evaluated, some may be new or changing and the patient has concerns that these could be cancer.   The following portions of the chart were reviewed this encounter and updated as appropriate:   Tobacco   Allergies   Meds   Problems   Med Hx   Surg Hx   Fam Hx      Review of Systems:  No other skin or systemic complaints except as noted in HPI or Assessment and Plan.  Objective  Well appearing patient in no apparent distress; mood and affect are within normal limits.  A full examination was performed including scalp, head, eyes, ears, nose, lips, neck, chest, axillae, abdomen, back, buttocks, bilateral upper extremities, bilateral lower extremities, hands, feet, fingers, toes, fingernails, and toenails. All findings within normal limits unless otherwise noted below.  right medial cheek Yellowish papule   face Mainly clear skin   left scalp supra auricular 0.25 cm regular brown macule        bilateral legs Tiny follicular keratotic papules.   left knee area Firm pink/brown papulenodule with dimple sign.   scalp, eyebrows Clear skin   lips Dry skin    Assessment & Plan  Sebaceous hyperplasia right medial cheek  The patient will observe these symptoms, and report promptly any worsening or unexpected persistence.  If well, may return prn.   Acne vulgaris face Chronic; persistent but improved Continue Clindamycin-tretinoin gel apply to face daily   Related Medications clindamycin-tretinoin (VELTIN) gel Apply topically at bedtime.  Nevus left scalp supra auricular See Photo Benign-appearing.   Observation.  Call clinic for new or changing lesions.  Recommend daily use of broad spectrum spf 30+ sunscreen to sun-exposed areas.    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  Observation.  Call clinic for new or changing moles.  Recommend daily use of broad spectrum spf 30+ sunscreen to sun-exposed areas.    Psoriasis scalp, eyebrows  Psoriasis is a chronic non-curable, but treatable genetic/hereditary disease that may have other systemic features affecting other organ systems such as joints (Psoriatic Arthritis). It is associated with an increased risk of inflammatory bowel disease, heart disease, non-alcoholic fatty liver disease, and depression.     Cont Mometasone solution apply to affected skin qd-bid prn flares   Related Medications mometasone (ELOCON) 0.1 % lotion Apply topically daily. Avoid applying to face, groin, and axilla. Use as directed. Risk of skin atrophy with long-term use reviewed.  Cheilitis lips  Continue lip balm as directed, Dr. Luvenia Heller Cortibalm or Aquaphor recommended  Start Tacrolimus ointment apply to lips daily   Related Medications tacrolimus (PROTOPIC) 0.1 % ointment Apply to lips daily prn  Lentigines - Scattered tan macules - Due to sun exposure - Benign-appearing, observe - Recommend daily broad spectrum sunscreen SPF 30+ to sun-exposed areas, reapply every 2 hours as needed. - Call for any changes  Seborrheic Keratoses - Stuck-on, waxy, tan-brown papules and/or plaques  - Benign-appearing - Discussed benign etiology and prognosis. - Observe - Call for any changes  Melanocytic Nevi - Tan-brown and/or pink-flesh-colored symmetric  macules and papules - Benign appearing on exam today - Observation - Call clinic for new or changing moles - Recommend daily use of broad spectrum spf 30+ sunscreen to sun-exposed areas.   Hemangiomas - Red  papules - Discussed benign nature - Observe - Call for any changes  Actinic Damage - Chronic condition, secondary to cumulative UV/sun exposure - diffuse scaly erythematous macules with underlying dyspigmentation - Recommend daily broad spectrum sunscreen SPF 30+ to sun-exposed areas, reapply every 2 hours as needed.  - Staying in the shade or wearing long sleeves, sun glasses (UVA+UVB protection) and wide brim hats (4-inch brim around the entire circumference of the hat) are also recommended for sun protection.  - Call for new or changing lesions.  History of Dysplastic Nevi Right lateral to crown scalp/mod 11/11/2019 - No evidence of recurrence today - Recommend regular full body skin exams - Recommend daily broad spectrum sunscreen SPF 30+ to sun-exposed areas, reapply every 2 hours as needed.  - Call if any new or changing lesions are noted between office visits   Cheilitis - Continue lip balm as directed, Dr. Luvenia Heller Cortibalm or Aquaphor recommended  Start Tacrolimus ointment apply to lips daily   Skin cancer screening performed today.   Return in about 1 year (around 11/18/2022) for TBSE, Hx of Dysplastic nevus .  IMarye Round, CMA, am acting as scribe for Sarina Ser, MD .  Documentation: I have reviewed the above documentation for accuracy and completeness, and I agree with the above.  Sarina Ser, MD

## 2021-11-18 NOTE — Patient Instructions (Addendum)
Dr. Luvenia Heller Cortibalm for lips  Over the counter Amlactin rapid relief for legs     If You Need Anything After Your Visit  If you have any questions or concerns for your doctor, please call our main line at [phone] and press option 4 to reach your doctor's medical assistant. If no one answers, please leave a voicemail as directed and we will return your call as soon as possible. Messages left after 4 pm will be answered the following business day.   You may also send [place] a message via [place]. We typically respond to MyChart messages within 1-2 business days.  For prescription refills, please ask your pharmacy to contact our office. Our fax number is [phone].  If you have an urgent issue when the clinic is closed that cannot wait until the next business day, you can page your doctor at the number below.    Please note that while we do our best to be available for urgent issues outside of office hours, we are not available 24/7.   If you have an urgent issue and are unable to reach [place], you may choose to seek medical care at your doctor's office, retail clinic, urgent care center, or emergency room.  If you have a medical emergency, please immediately call 911 or go to the emergency department.  Pager Numbers  - Dr. Nehemiah Massed: [phone]  - Dr. Laurence Ferrari: [phone]  - Dr. Nicole Kindred: [phone]  In the event of inclement weather, please call our main line at [phone] for an update on the status of any delays or closures.  Dermatology Medication Tips: Please keep the boxes that topical medications come in in order to help keep track of the instructions about where and how to use these. Pharmacies typically print the medication instructions only on the boxes and not directly on the medication tubes.   If your medication is too expensive, please contact our office at [phone] option 4 or send [place] a message through [place].   We are unable to tell what your co-pay for  medications will be in advance as this is different depending on your insurance coverage. However, we may be able to find a substitute medication at lower cost or fill out paperwork to get insurance to cover a needed medication.   If a prior authorization is required to get your medication covered by your insurance company, please allow [place] 1-2 business days to complete this process.  Drug prices often vary depending on where the prescription is filled and some pharmacies may offer cheaper prices.  The website www.goodrx.com contains coupons for medications through different pharmacies. The prices here do not account for what the cost may be with help from insurance (it may be cheaper with your insurance), but the website can give you the price if you did not use any insurance.  - You can print the associated coupon and take it with your prescription to the pharmacy.  - You may also stop by our office during regular business hours and pick up a GoodRx coupon card.  - If you need your prescription sent electronically to a different pharmacy, notify our office through [facility] or by phone at [phone] option 4.     Si Usted Necesita Algo Despus de Su Visita  Tambin puede enviarnos un mensaje a travs de Pharmacist, community. Por lo general respondemos a los mensajes de MyChart en el transcurso de 1 a 2 das hbiles.  Para renovar recetas, por favor pida a su farmacia  que se ponga en contacto con nuestra oficina. Harland Dingwall de fax es [place] [phone].  Si tiene un asunto urgente cuando la clnica est cerrada y que no puede esperar hasta el siguiente da hbil, puede llamar/localizar a su doctor(a) al nmero que aparece a continuacin.   Por favor, tenga en cuenta que aunque hacemos todo lo posible para estar disponibles para asuntos urgentes fuera del horario de [place], no estamos disponibles las 24 horas del da, los 7 das de la [place].   Si tiene un problema urgente y no puede  comunicarse con nosotros, puede optar por buscar atencin mdica  en el consultorio de su doctor(a), en una clnica privada, en un centro de atencin urgente o en una sala de emergencias.  Si tiene Engineering geologist, por favor llame inmediatamente al 911 o vaya a la sala de emergencias.  Nmeros de bper  - Dr. Nehemiah Massed: [phone]  - Dra. Moye: [phone]  - Dra. Nicole Kindred: [phone]  En caso de inclemencias del [place], por favor llame a Johnsie Kindred principal al [phone] para una actualizacin sobre el [place] de cualquier retraso o cierre.  Consejos para la medicacin en dermatologa: Por favor, guarde las cajas en las que vienen los medicamentos de uso tpico para ayudarle a seguir las instrucciones sobre dnde y cmo usarlos. Las farmacias generalmente imprimen las instrucciones del medicamento slo en las cajas y no directamente en los tubos del [place].   Si su medicamento es muy caro, por favor, pngase en contacto con Zigmund Daniel llamando al [phone] y presione la opcin 4 o envenos un mensaje a travs de Pharmacist, community.   No podemos decirle cul ser su copago por los medicamentos por adelantado ya que esto es diferente dependiendo de la cobertura de su seguro. Sin embargo, es posible que podamos encontrar un medicamento sustituto a Electrical engineer un formulario para que el seguro cubra el medicamento que se considera necesario.   Si se requiere una autorizacin previa para que su compaa de seguros [place] su medicamento, por favor permtanos de 1 a 2 das hbiles para completar este proceso.  Los precios de los medicamentos varan con frecuencia dependiendo del Environmental consultant de dnde se surte la receta y alguna farmacias pueden ofrecer precios ms baratos.  El sitio web www.goodrx.com tiene cupones para medicamentos de Airline pilot. Los precios aqu no tienen en cuenta lo que podra costar con la ayuda del seguro (puede ser ms barato con su seguro), pero  el sitio web puede darle el precio si no utiliz Research scientist (physical sciences).  - Puede imprimir el cupn correspondiente y llevarlo con su receta a la farmacia.  - Tambin puede pasar por nuestra oficina durante el horario de atencin regular y Charity fundraiser una tarjeta de cupones de GoodRx.  - Si necesita que su receta se enve electrnicamente a una farmacia diferente, informe a nuestra oficina a travs de MyChart de [place] o por telfono llamando al [phone] y presione la opcin 4.

## 2021-11-23 ENCOUNTER — Encounter: Payer: Self-pay | Admitting: Dermatology

## 2021-12-16 ENCOUNTER — Ambulatory Visit: Payer: BC Managed Care – PPO | Admitting: Obstetrics and Gynecology

## 2021-12-16 IMAGING — CT CT RENAL STONE PROTOCOL
2 of 4 series · 15 of 46 positions shown, 17 images · non-contrast
Comparison: CT the abdomen and pelvis 09/17/2014.

CLINICAL DATA: 60-year-old female with history of left-sided
abdominal pain since [REDACTED].

EXAM:
CT ABDOMEN AND PELVIS WITHOUT CONTRAST
TECHNIQUE: Multidetector CT imaging of the abdomen and pelvis was performed
following the standard protocol without IV contrast.

[Series 2: stone full standard · axial · 0.69mm/px · z∈[-118,+256]mm · 12 of 83 slices shown, 14 images]
[im 4/83  soft-tissue]
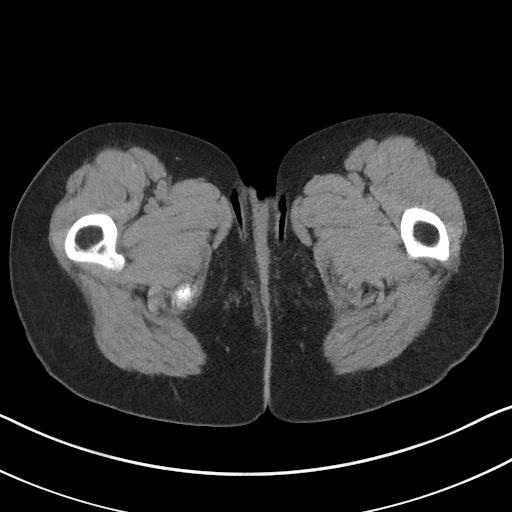
[im 4/83  bone]
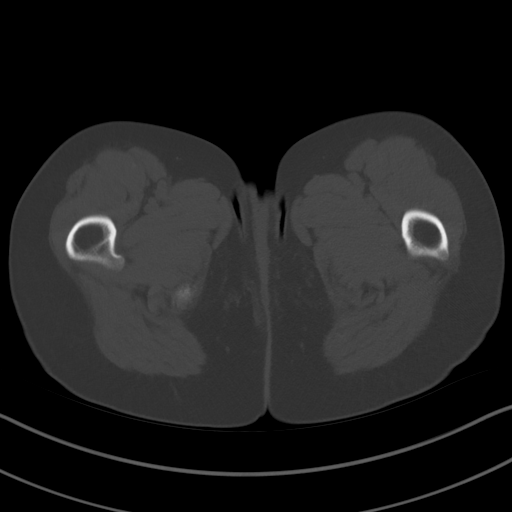
[im 11/83  soft-tissue]
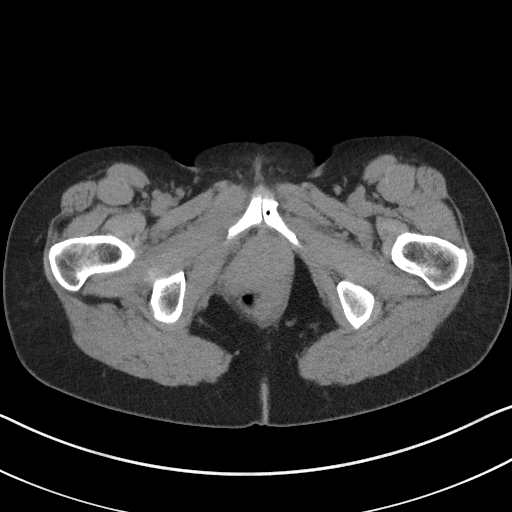
[im 18/83  soft-tissue]
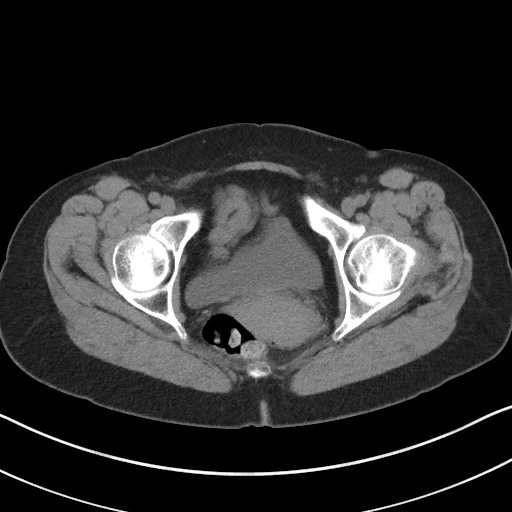
[im 24/83  soft-tissue]
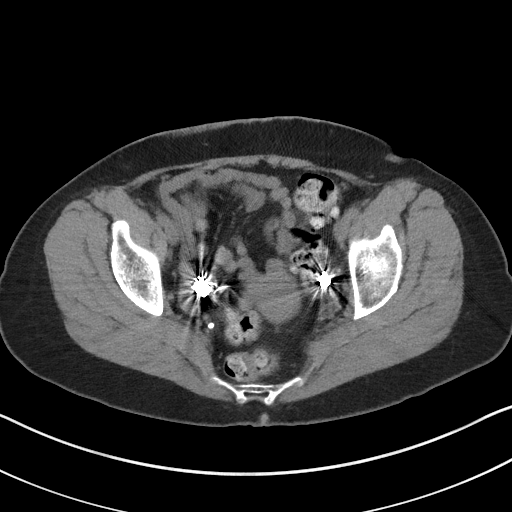
[im 31/83  soft-tissue]
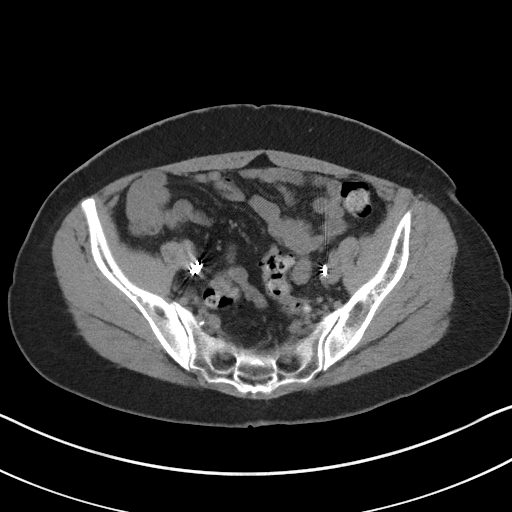
[im 38/83  soft-tissue]
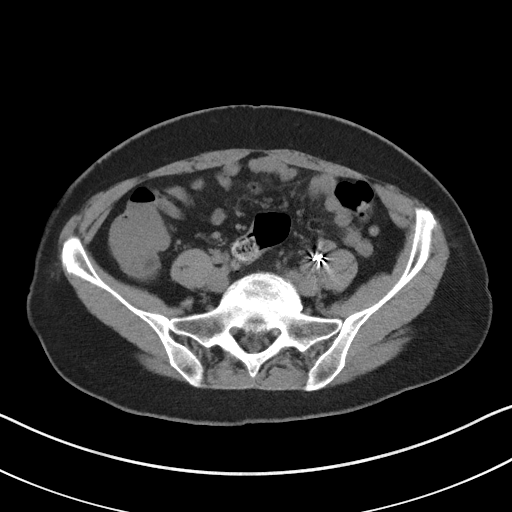
[im 45/83  soft-tissue]
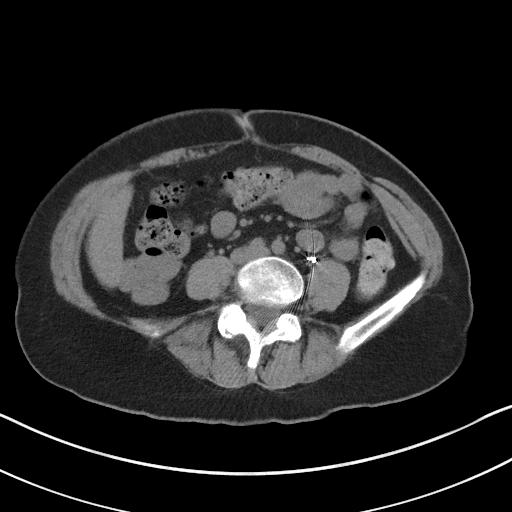
[im 52/83  soft-tissue]
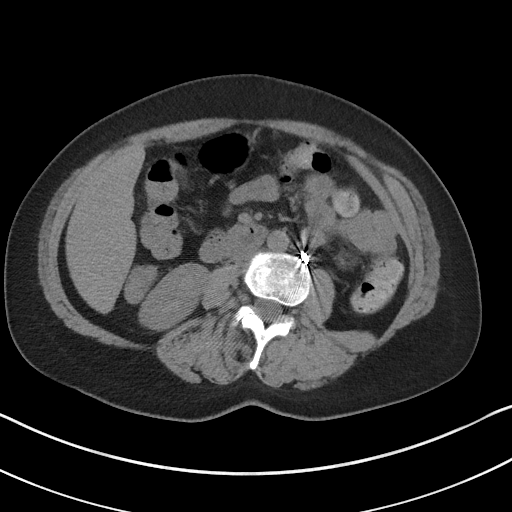
[im 59/83  soft-tissue]
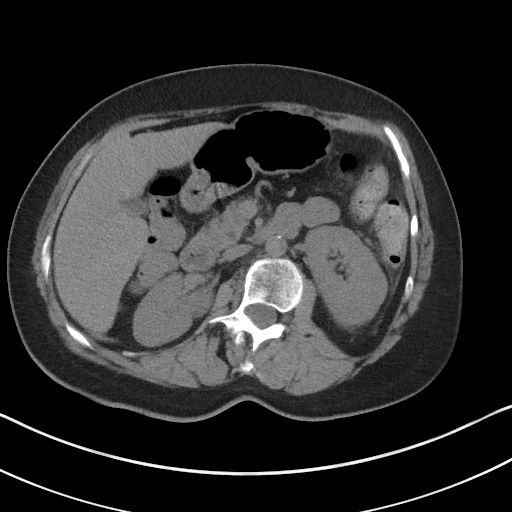
[im 59/83  bone]
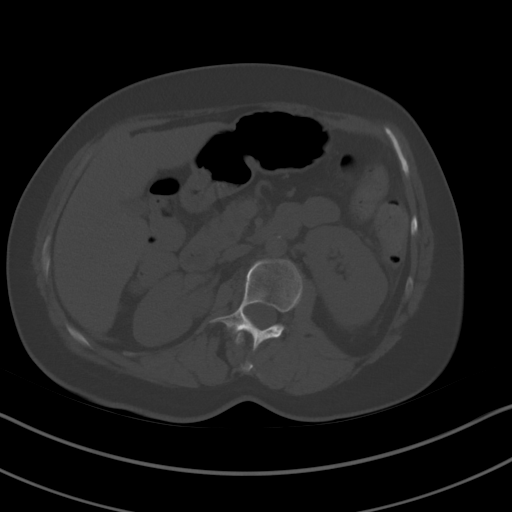
[im 65/83  soft-tissue]
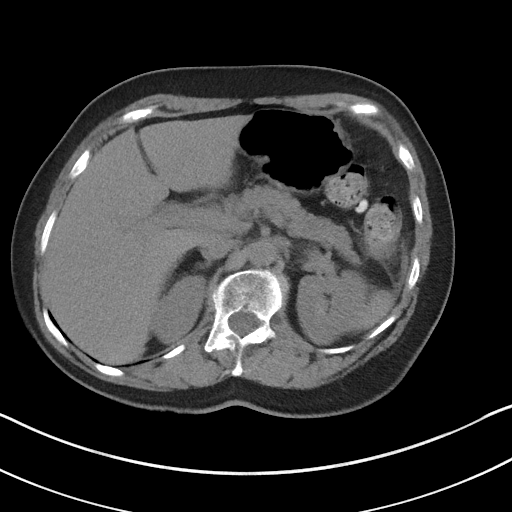
[im 72/83  soft-tissue]
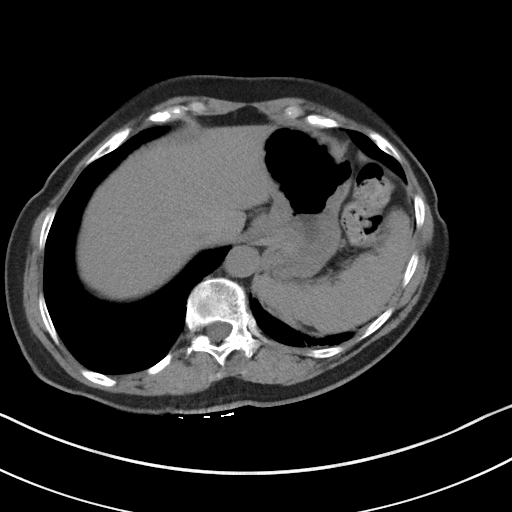
[im 79/83  soft-tissue]
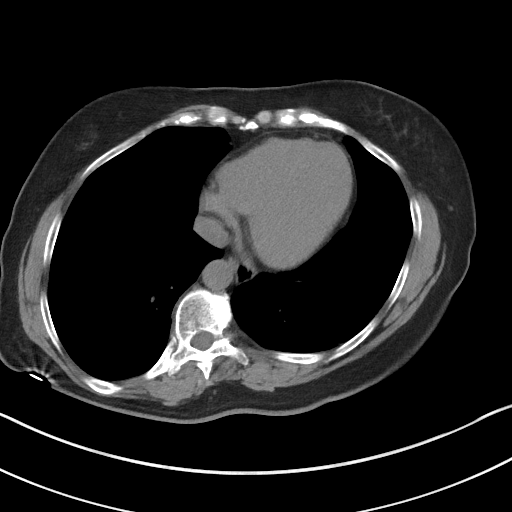

[Series 5: coronal · coronal · 0.72mm/px · 3 of 130 slices shown]
[im 44/130  soft-tissue]
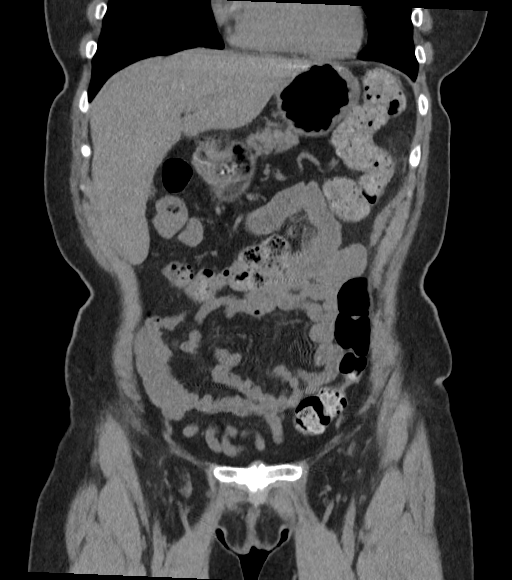
[im 58/130  soft-tissue]
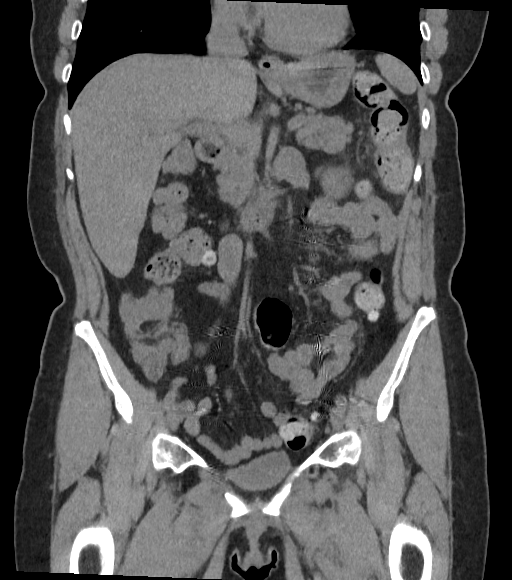
[im 72/130  soft-tissue]
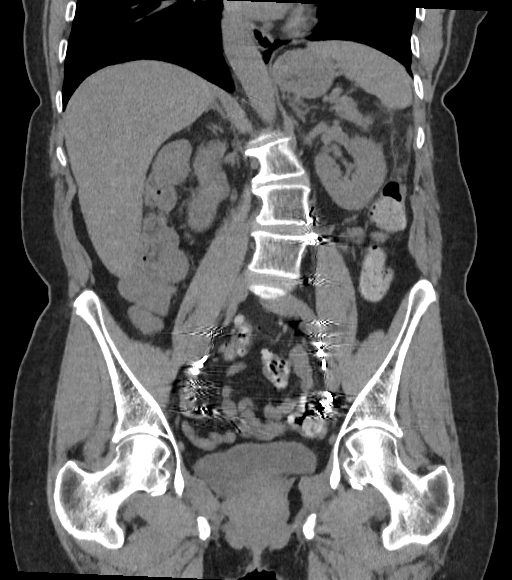

[15 of 46 positions shown; findings below may reference images not displayed]

FINDINGS: Lower chest: Linear areas of scarring are again noted in the left
lower lobe. Atherosclerotic calcifications in the distal descending
thoracic aorta.

Hepatobiliary: No suspicious cystic or solid hepatic lesions are
confidently identified on today's noncontrast CT examination.
Unenhanced appearance of the gallbladder is normal.

Pancreas: No definite pancreatic mass or peripancreatic fluid
collections or inflammatory changes are noted on today's noncontrast
CT examination.

Spleen: Unremarkable.

Adrenals/Urinary Tract: 2 mm nonobstructive calculus in the lower
pole collecting system of the right kidney. No additional calculi
are noted within the left renal collecting system, along the course
of either ureter, or within the lumen of the urinary bladder. No
hydroureteronephrosis. Unenhanced appearance of the kidneys and
bilateral adrenal glands is otherwise normal. Urinary bladder is
normal in appearance.

Stomach/Bowel: Unenhanced appearance of the stomach is normal. No
pathologic dilatation of small bowel or colon. Numerous colonic
diverticulae are noted. In the region of the splenic flexure and
proximal descending colon there are some surrounding inflammatory
changes, favored to reflect an acute diverticulitis. No discrete
diverticular abscess or signs of frank perforation or confidently
identified at this time. The appendix is not confidently identified
and may be surgically absent. Regardless, there are no inflammatory
changes noted adjacent to the cecum to suggest the presence of an
acute appendicitis at this time.

Vascular/Lymphatic: Aortic atherosclerosis. Embolization coils are
noted in the region of the gonadal veins bilaterally. No
lymphadenopathy noted in the abdomen or pelvis.

Reproductive: Uterus and ovaries are unremarkable in appearance.

Other: No significant volume of ascites.  No pneumoperitoneum.

Musculoskeletal: S shaped scoliosis of the thoracolumbar spine
convex to the right superiorly into the left inferiorly. There are
no aggressive appearing lytic or blastic lesions noted in the
visualized portions of the skeleton.
IMPRESSION: 1. Inflammatory changes surrounding the splenic flexure and proximal
descending colon, likely reflecting an acute diverticulitis. No
definite diverticular abscess or signs of frank perforation are
noted at this time.
2. 2 mm nonobstructive calculus in the lower pole collecting system
of the right kidney.
3. Aortic atherosclerosis.
4. Additional incidental findings, as above.

## 2021-12-23 ENCOUNTER — Encounter: Payer: Self-pay | Admitting: Obstetrics and Gynecology

## 2021-12-23 ENCOUNTER — Ambulatory Visit (INDEPENDENT_AMBULATORY_CARE_PROVIDER_SITE_OTHER): Payer: BC Managed Care – PPO | Admitting: Obstetrics and Gynecology

## 2021-12-23 ENCOUNTER — Other Ambulatory Visit: Payer: Self-pay

## 2021-12-23 VITALS — BP 120/70 | Ht 63.0 in | Wt 132.4 lb

## 2021-12-23 DIAGNOSIS — R300 Dysuria: Secondary | ICD-10-CM | POA: Diagnosis not present

## 2021-12-23 DIAGNOSIS — Z01419 Encounter for gynecological examination (general) (routine) without abnormal findings: Secondary | ICD-10-CM | POA: Diagnosis not present

## 2021-12-23 DIAGNOSIS — N771 Vaginitis, vulvitis and vulvovaginitis in diseases classified elsewhere: Secondary | ICD-10-CM

## 2021-12-23 DIAGNOSIS — Z1231 Encounter for screening mammogram for malignant neoplasm of breast: Secondary | ICD-10-CM | POA: Diagnosis not present

## 2021-12-23 DIAGNOSIS — N952 Postmenopausal atrophic vaginitis: Secondary | ICD-10-CM

## 2021-12-23 DIAGNOSIS — Z124 Encounter for screening for malignant neoplasm of cervix: Secondary | ICD-10-CM | POA: Diagnosis not present

## 2021-12-23 DIAGNOSIS — R875 Abnormal microbiological findings in specimens from female genital organs: Secondary | ICD-10-CM

## 2021-12-23 DIAGNOSIS — B354 Tinea corporis: Secondary | ICD-10-CM

## 2021-12-23 LAB — POCT URINALYSIS DIPSTICK
Bilirubin, UA: NEGATIVE
Blood, UA: NEGATIVE
Glucose, UA: NEGATIVE
Ketones, UA: NEGATIVE
Nitrite, UA: NEGATIVE
Protein, UA: NEGATIVE
Spec Grav, UA: 1.02 (ref 1.010–1.025)
Urobilinogen, UA: 0.2 E.U./dL
pH, UA: 5 (ref 5.0–8.0)

## 2021-12-23 MED ORDER — PREMARIN 0.625 MG/GM VA CREA: 1.0000 | TOPICAL_CREAM | Freq: Every day | VAGINAL | 12 refills | Status: DC

## 2021-12-23 MED ORDER — NYSTATIN 100000 UNIT/GM EX POWD: 1.0000 "application " | Freq: Three times a day (TID) | CUTANEOUS | 6 refills | Status: AC

## 2021-12-23 NOTE — Progress Notes (Signed)
Gynecology Annual Exam  PCP: Kirk Ruths, MD  Chief Complaint:  Chief Complaint  Patient presents with   Gynecologic Exam    Annual Exam    History of Present Illness: Patient is a 61 y.o. [id number] presents for annual exam. The patient has no complaints today.   LMP: No LMP recorded. Patient is postmenopausal. She denies postmenopausal bleeding or spotting  The patient is sexually active. She has dyspareunia.  Postcoital Bleeding: no   The patient does perform self breast exams.  There is notable family history of breast or ovarian cancer in her family.  The patient has regular exercise: walking 3 days a week  The patient denies current symptoms of depression.   Review of Systems: Review of Systems  Constitutional:  Negative for chills, fever, malaise/fatigue and weight loss.  HENT:  Negative for congestion, hearing loss and sinus pain.   Eyes:  Negative for blurred vision and double vision.  Respiratory:  Negative for cough, sputum production, shortness of breath and wheezing.   Cardiovascular:  Negative for chest pain, palpitations, orthopnea and leg swelling.  Gastrointestinal:  Negative for abdominal pain, constipation, diarrhea, nausea and vomiting.  Genitourinary:  Negative for dysuria, flank pain, frequency, hematuria and urgency.  Musculoskeletal:  Negative for back pain, falls and joint pain.  Skin:  Negative for itching and rash.  Neurological:  Negative for dizziness and headaches.  Psychiatric/Behavioral:  Negative for depression, substance abuse and suicidal ideas. The patient is not nervous/anxious.    Past Medical History:  Past Medical History:  Diagnosis Date   Abnormal Pap smear of cervix 2004   Diverticulitis    History of dysplastic nevus 11/11/2019    right lateral to crown scalp/moderate   Hyperlipidemia    Liver disease    Fatty liver   Ulcerative colitis ([place])     Past Surgical History:  Past Surgical History:  Procedure  Laterality Date   COLONOSCOPY     COLPOSCOPY  2004   DILATION AND CURETTAGE OF UTERUS     TUBAL LIGATION  1989   VARICOSE VEIN SURGERY     vulvar hematoma      Gynecologic History:  No LMP recorded. Patient is postmenopausal. Last Pap: Results were: 2022 NIL   Last mammogram: 2022 Results were: BI-RAD I  Obstetric History: G2P2002  Family History:  Family History  Problem Relation Age of Onset   Breast cancer Paternal Aunt 34   Breast cancer Maternal Grandmother 75   Diabetes Mother    Hypertension Mother    Prostate cancer Father    Colon cancer Paternal Aunt     Social History:  Social History   Socioeconomic History   Marital status: Widowed    Spouse name: Not on file   Number of children: Not on file   Years of education: Not on file   Highest education level: Not on file  Occupational History   Not on file  Tobacco Use   Smoking status: Never   Smokeless tobacco: Never  Vaping Use   Vaping Use: Never used  Substance and Sexual Activity   Alcohol use: No   Drug use: No   Sexual activity: Yes    Partners: Male    Birth control/protection: Post-menopausal  Other Topics Concern   Not on file  Social History Narrative   Not on file   Social Determinants of Health   Financial Resource Strain: Not on file  Food Insecurity: Not on file  Transportation Needs:  Not on file  Physical Activity: Not on file  Stress: Not on file  Social Connections: Not on file  Intimate Partner Violence: Not on file    Allergies:  Allergies  Allergen Reactions   Metronidazole Nausea Only    Medications: Prior to Admission medications   Medication Sig Start Date End Date Taking? Authorizing Provider  azelastine (ASTELIN) 0.1 % nasal spray Place into the nose.   Yes [provider]  Calcium Carbonate-Vitamin D 600-400 MG-UNIT tablet Take by mouth.   Yes [provider]  Cholecalciferol 25 MCG (1000 UT) tablet Take by mouth.   Yes [provider]  clindamycin-tretinoin (VELTIN) gel Apply topically at bedtime. 11/18/21  Yes Ralene Bathe, MD  clotrimazole-betamethasone (LOTRISONE) cream Apply 1 application topically 2 (two) times daily. 11/05/18  Yes Gae Dry, MD  mometasone (ELOCON) 0.1 % lotion Apply topically daily. Avoid applying to face, groin, and axilla. Use as directed. Risk of skin atrophy with long-term use reviewed. 11/18/21  Yes Ralene Bathe, MD  Multiple Vitamin (MULTI-VITAMIN) tablet Take by mouth.   Yes [provider]  nystatin (MYCOSTATIN/NYSTOP) powder Apply 1 application topically 3 (three) times daily. 12/14/20  Yes Malachy Mood, MD  pravastatin (PRAVACHOL) 10 MG tablet  09/21/19  Yes [provider]  tacrolimus (PROTOPIC) 0.1 % ointment Apply to lips daily prn 11/18/21  Yes Ralene Bathe, MD  clindamycin-tretinoin [place]) gel APPLY A THIN LAYER TO FACE FOR ACNE AT BEDTIME Patient not taking: Reported on 12/14/2020 11/01/17   [provider]  conjugated estrogens (PREMARIN) vaginal cream PLACE 1 APPLICATORFUL  VAGINALLY TWICE A WEEK Patient not taking: Reported on 11/16/2020 09/15/17   [provider]  dicyclomine (BENTYL) 10 MG capsule Take 1 capsule by mouth four times a day before meals as needed for pain 06/28/17   [provider]  ezetimibe (ZETIA) 10 MG tablet TAKE 1 TABLET(10 MG) BY MOUTH EVERY DAY 06/03/19   [provider]  mometasone (ELOCON) 0.1 % lotion APPLY 1 MILLILITER TO THE AFFECTED AREA OF THE SKIN ONCE DAILY AS NEEDED Patient not taking: Reported on 12/14/2020 10/30/17   [provider]  omeprazole (PRILOSEC OTC) 20 MG tablet Take by mouth. Patient not taking: Reported on 12/14/2020    [provider]  Pramoxine-HC 1-2.5 % OINT Apply topically. Patient not taking: Reported on 12/14/2020    [provider]    Physical Exam Vitals: Blood pressure 120/70, height 5\' 3"  (1.6 m), weight 132 lb 6.4 oz  (60.1 kg).  Physical Exam Constitutional:      Appearance: She is well-developed.  Genitourinary:     Genitourinary Comments: External: Normal appearing vulva. No lesions noted.  Speculum examination: Normal appearing cervix. No blood in the vaginal vault. No discharge.  Bimanual examination: Uterus midline, non-tender, normal in size, shape and contour.  No CMT. No adnexal masses. No adnexal tenderness. Pelvis not fixed.  Breast Exam: breast equal without skin changes, nipple discharge, breast lump or enlarged lymph nodes   HENT:     Head: Normocephalic and atraumatic.  Neck:     Thyroid: No thyromegaly.  Cardiovascular:     Rate and Rhythm: Normal rate and regular rhythm.     Heart sounds: Normal heart sounds.  Pulmonary:     Effort: Pulmonary effort is normal.     Breath sounds: Normal breath sounds.  Abdominal:     General: Bowel sounds are normal. There is no distension.     Palpations:  Abdomen is soft. There is no mass.  Musculoskeletal:     Cervical back: Neck supple.  Neurological:     Mental Status: She is alert and oriented to person, place, and time.  Skin:    General: Skin is warm and dry.  Psychiatric:        Behavior: Behavior normal.        Thought Content: Thought content normal.        Judgment: Judgment normal.  Vitals reviewed.     Female chaperone present for pelvic and breast  portions of the physical exam  Assessment: 61 y.o. G2P2002 routine annual exam  Plan: Problem List Items Addressed This Visit   None Visit Diagnoses     Encounter for annual routine gynecological examination    -  Primary   Breast cancer screening by mammogram       Relevant Orders   MM 3D SCREEN BREAST BILATERAL   Cervical cancer screening       Vaginal atrophy       Relevant Medications   conjugated estrogens (PREMARIN) vaginal cream   Dysuria       Relevant Orders   Urine Culture   POCT Urinalysis Dipstick (Completed)   Tinea corporis       Relevant Medications    nystatin (MYCOSTATIN/NYSTOP) powder   Vaginitis, vulvitis and vulvovaginitis in diseases classified elsewhere       Relevant Medications   nystatin (MYCOSTATIN/NYSTOP) powder   Other Relevant Orders   NuSwab BV and Candida, NAA   Abnormal microbiological finding in specimen from female genital organ       Relevant Orders   NuSwab BV and Candida, NAA       1) Mammogram - recommend yearly screening mammogram.  Mammogram  was ordered today  2) STI screening was offered and declined  3) Pap smear up to date  4) Colonoscopy -- last in 2019- 5 year follow up recommended  5) Routine healthcare maintenance including cholesterol, diabetes screening discussed managed by PCP  6) Osteoporosis screening - plan for 65, no increased risk factors  Adrian Prows MD, [place], [facility] 12/23/2021 11:56 AM

## 2021-12-23 NOTE — Patient Instructions (Signed)
[facility] [place] for Calcium and Vitamin D  Age (yr) Calcium Recommended Dietary Allowance (mg/day) Vitamin D Recommended Dietary Allowance (international units/day)  9-18 1,300 600  19-50 1,000 600  51-70 1,200 600  71 and older 1,200 800  Data from Institute of [employer]. Dietary reference intakes: calcium, vitamin D. [place], [place]: [employer]; 2011.   Exercising to Stay Healthy To become healthy and stay healthy, it is recommended that you do moderate-intensity and vigorous-intensity exercise. You can tell that you are exercising at a moderate intensity if your heart starts beating faster and you start breathing faster but can still hold a conversation. You can tell that you are exercising at a vigorous intensity if you are breathing much harder and faster and cannot hold a conversation while exercising. How can exercise benefit me? Exercising regularly is important. It has many health benefits, such as: Improving overall fitness, flexibility, and endurance. Increasing bone density. Helping with weight control. Decreasing body fat. Increasing muscle strength and endurance. Reducing stress and tension, anxiety, depression, or anger. Improving overall health. What guidelines should I follow while exercising? Before you start a new exercise program, talk with your health care provider. Do not exercise so much that you hurt yourself, feel dizzy, or get very short of breath. Wear comfortable clothes and wear shoes with good support. Drink plenty of water while you exercise to prevent dehydration or heat stroke. Work out until your breathing and your heartbeat get faster (moderate intensity). How often should I exercise? Choose an activity that you enjoy, and set realistic goals. Your health care provider can help you make an activity plan that is individually designed and works best for you. Exercise regularly as told by your health  care provider. This may include: Doing strength training two times a week, such as: Lifting weights. Using resistance bands. Push-ups. Sit-ups. Yoga. Doing a certain intensity of exercise for a given amount of time. Choose from these options: A total of 150 minutes of moderate-intensity exercise every week. A total of 75 minutes of vigorous-intensity exercise every week. A mix of moderate-intensity and vigorous-intensity exercise every week. Children, pregnant women, people who have not exercised regularly, people who are overweight, and older adults may need to talk with a health care provider about what activities are safe to perform. If you have a medical condition, be sure to talk with your health care provider before you start a new exercise program. What are some exercise ideas? Moderate-intensity exercise ideas include: Walking 1 mile (1.6 km) in about 15 minutes. Biking. Hiking. Golfing. Dancing. Water aerobics. Vigorous-intensity exercise ideas include: Walking 4.5 miles (7.2 km) or more in about 1 hour. Jogging or running 5 miles (8 km) in about 1 hour. Biking 10 miles (16.1 km) or more in about 1 hour. Lap swimming. Roller-skating or in-line skating. Cross-country skiing. Vigorous competitive sports, such as football, basketball, and soccer. Jumping rope. Aerobic dancing. What are some everyday activities that can help me get exercise? Yard work, such as: Psychologist, educational. Raking and bagging leaves. Washing your car. Pushing a stroller. Shoveling snow. Gardening. Washing windows or floors. How can I be more active in my day-to-day activities? Use stairs instead of an elevator. Take a walk during your lunch break. If you drive, park your car farther away from your work or school. If you take public transportation, get off one stop early and walk the rest of the way. Stand up or walk around during all of  your indoor phone calls. Get up, stretch, and walk  around every 30 minutes throughout the day. Enjoy exercise with a friend. Support to continue exercising will help you keep a regular routine of activity. Where to find more information You can find more information about exercising to stay healthy from: U.S. Department of Health and Human Services: [employer]at Centers for Disease Control and Prevention (CDC): http://www.wolf.info/ Summary Exercising regularly is important. It will improve your overall fitness, flexibility, and endurance. Regular exercise will also improve your overall health. It can help you control your weight, reduce stress, and improve your bone density. Do not exercise so much that you hurt yourself, feel dizzy, or get very short of breath. Before you start a new exercise program, talk with your health care provider. This information is not intended to replace advice given to you by your health care provider. Make sure you discuss any questions you have with your health care provider. Document Revised: 02/05/2021 Document Reviewed: 02/05/2021 Elsevier Patient Education  2022 [place] Eating There are many ways to save money at the grocery store and continue to eat healthy. You can be successful if you: Plan meals according to your budget. Make a grocery list and only purchase food according to your grocery list. Prepare food yourself at home. What are tips for following this plan? Reading food labels Compare food labels between brand name foods and the store brand. Often the nutritional value is the same, but the store brand is lower cost. Look for products that do not have added sugar, fat, or salt (sodium). These often cost the same but are healthier for you. Products may be labeled as: Sugar-free. Nonfat. Low-fat. Sodium-free. Low-sodium. Look for lean ground beef labeled as at least 92% lean and 8% fat. Shopping  Buy only the items on your grocery list and go only to the areas of the store  that have the items on your list. Use coupons only for foods and brands you normally buy. Avoid buying items you wouldn't normally buy simply because they are on sale. Check online and in newspapers for weekly deals. Buy healthy items from the bulk bins when available, such as herbs, spices, flour, pasta, nuts, and dried fruit. Buy fruits and vegetables that are in season. Prices are usually lower on in-season produce. Look at the unit price on the price tag. Use it to compare different brands and sizes to find out which item is the best deal. Choose healthy items that are often low-cost, such as carrots, potatoes, apples, bananas, and oranges. Dried or canned beans are a low-cost protein source. Buy in bulk and freeze extra food. Items you can buy in bulk include meats, fish, poultry, frozen fruits, and frozen vegetables. Avoid buying "ready-to-eat" foods, such as pre-cut fruits and vegetables and pre-made salads. If possible, shop around to discover where you can find the best prices. Consider other retailers such as dollar stores, larger [employer], local fruit and vegetable stands, and farmers markets. Do not shop when you are hungry. If you shop while hungry, it may be hard to stick to your list and budget. Resist impulse buying. Use your grocery list as your official plan for the week. Buy a variety of vegetables and fruits by purchasing fresh, frozen, and canned items. Look at the top and bottom shelves for deals. Foods at eye level (eye level of an adult or child) are usually more expensive. Be efficient with your time when shopping. The more time you  spend at the store, the more money you are likely to spend. To save money when choosing more expensive foods like meats and dairy: Choose cheaper cuts of meat, such as bone-in chicken thighs and drumsticks instead of skinless and boneless chicken. When you are ready to prepare the chicken, you can remove the skin yourself to make it  healthier. Choose lean meats like chicken or [place] instead of beef. Choose canned seafood, such as tuna, salmon, or sardines. Buy eggs as a low-cost source of protein. Buy dried beans and peas, such as lentils, split peas, or kidney beans instead of meats. Dried beans and peas are a good alternative source of protein. Buy the larger tubs of yogurt instead of individual-sized containers. Choose water instead of sodas and other sweetened beverages. Avoid buying chips, cookies, and other "junk food." These items are usually expensive and not healthy. Cooking Make extra food and freeze the extras in meal-sized containers or in individual portions for fast meals and snacks. Pre-cook on days when you have extra time to prepare meals in advance. You can keep these meals in the fridge or freezer and reheat for a quick meal. When you come home from the grocery store, wash, peel, and cut fruits and vegetables so they are ready to use and eat. This will help reduce food waste. Meal planning Do not eat out or get fast food. Prepare food at home. Make a grocery list and make sure to bring it with you to the store. If you have a smart phone, you could use your phone to create your shopping list. Plan meals and snacks according to a grocery list and budget you create. Use leftovers in your meal plan for the week. Look for recipes where you can cook once and make enough food for two meals. Prepare budget-friendly types of meals like stews, casseroles, and stir-fry dishes. Try some meatless meals or try "no cook" meals like salads. Make sure that half your plate is filled with fruits or vegetables. Choose from fresh, frozen, or canned fruits and vegetables. If eating canned, remember to rinse them before eating. This will remove any excess salt added for packaging. Summary Eating healthy on a budget is possible if you plan your meals according to your budget, purchase according to your budget and grocery list,  and prepare food yourself. Tips for buying more food on a limited budget include buying generic brands, using coupons only for foods you normally buy, and buying healthy items from the bulk bins when available. Tips for buying cheaper food to replace expensive food include choosing cheaper, lean cuts of meat, and buying dried beans and peas. This information is not intended to replace advice given to you by your health care provider. Make sure you discuss any questions you have with your health care provider. Document Revised: 07/23/2020 Document Reviewed: 07/23/2020 Elsevier Patient Education  [place].

## 2021-12-25 LAB — NUSWAB BV AND CANDIDA, NAA
Candida albicans, NAA: NEGATIVE
Candida glabrata, NAA: NEGATIVE

## 2021-12-25 LAB — URINE CULTURE: Organism ID, Bacteria: NO GROWTH

## 2022-02-07 ENCOUNTER — Other Ambulatory Visit: Payer: Self-pay | Admitting: Obstetrics and Gynecology

## 2022-02-07 ENCOUNTER — Telehealth: Payer: Self-pay

## 2022-02-07 DIAGNOSIS — N952 Postmenopausal atrophic vaginitis: Secondary | ICD-10-CM

## 2022-02-07 MED ORDER — ESTRADIOL 0.1 MG/GM VA CREA: 1.0000 g | TOPICAL_CREAM | VAGINAL | 2 refills | Status: AC

## 2022-02-07 NOTE — Telephone Encounter (Signed)
Can you send in a different rx, premarin not covered ?

## 2022-02-07 NOTE — Telephone Encounter (Signed)
sent 

## 2022-02-07 NOTE — Progress Notes (Signed)
Estrace rx sent ?

## 2022-03-03 ENCOUNTER — Ambulatory Visit
Admission: RE | Admit: 2022-03-03 | Discharge: 2022-03-03 | Disposition: A | Discharge status: Home / Self Care | Payer: BC Managed Care – PPO | Source: Ambulatory Visit | Attending: Obstetrics and Gynecology | Admitting: Obstetrics and Gynecology

## 2022-03-03 DIAGNOSIS — Z1231 Encounter for screening mammogram for malignant neoplasm of breast: Secondary | ICD-10-CM | POA: Diagnosis present

## 2022-06-06 ENCOUNTER — Ambulatory Visit: Payer: BC Managed Care – PPO | Admitting: Obstetrics & Gynecology

## 2022-06-06 ENCOUNTER — Other Ambulatory Visit (HOSPITAL_COMMUNITY)
Admission: RE | Admit: 2022-06-06 | Discharge: 2022-06-06 | Disposition: A | Discharge status: Home / Self Care | Payer: BC Managed Care – PPO | Source: Ambulatory Visit | Attending: Obstetrics & Gynecology | Admitting: Obstetrics & Gynecology

## 2022-06-06 ENCOUNTER — Telehealth: Payer: Self-pay

## 2022-06-06 VITALS — BP 102/66 | Wt 129.4 lb

## 2022-06-06 DIAGNOSIS — R82998 Other abnormal findings in urine: Secondary | ICD-10-CM | POA: Diagnosis not present

## 2022-06-06 DIAGNOSIS — N390 Urinary tract infection, site not specified: Secondary | ICD-10-CM | POA: Diagnosis present

## 2022-06-06 LAB — POCT URINALYSIS DIPSTICK
Blood, UA: NEGATIVE
Glucose, UA: NEGATIVE
Ketones, UA: NEGATIVE
Nitrite, UA: NEGATIVE
Protein, UA: NEGATIVE
Spec Grav, UA: >=1.03 — AB (ref 1.010–1.025)
Urobilinogen, UA: 0.2 E.U./dL
pH, UA: 6 (ref 5.0–8.0)

## 2022-06-06 NOTE — Telephone Encounter (Signed)
Pt calling triage c/o yeast infection symptoms.Can someone help add her to nurse schedule please ?

## 2022-06-07 LAB — CERVICOVAGINAL ANCILLARY ONLY
Bacterial Vaginitis (gardnerella): NEGATIVE
Candida Glabrata: NEGATIVE
Candida Vaginitis: NEGATIVE
Chlamydia: NEGATIVE
Comment: NEGATIVE
Comment: NEGATIVE
Comment: NEGATIVE
Comment: NEGATIVE
Comment: NEGATIVE
Comment: NORMAL
Neisseria Gonorrhea: NEGATIVE
Trichomonas: NEGATIVE

## 2022-06-08 ENCOUNTER — Telehealth: Payer: Self-pay

## 2022-06-08 LAB — URINE CULTURE: Organism ID, Bacteria: NO GROWTH

## 2022-06-08 NOTE — Telephone Encounter (Signed)
Pt calling; has seen her results; no one has called her; does she need a rx?  [phone]

## 2022-06-09 NOTE — Telephone Encounter (Signed)
Patient is calling to follow up on results. Please advise? Could you please follow up to [facility] please?

## 2022-06-09 NOTE — Telephone Encounter (Signed)
Spoke with patient on phone who stated that she had concerns of POCT urine in house showing abnormal and I informed patient that at visit it showed she had large WBC ( leukocytes) in urine and that is why it was sent for culture. I advised patient that urine culture did not show any growth. Patient reports that she was still experiencing lower back pain but states that it has been intermittent. Patient reports that she does have a history of scoliosis and wasn't sure If it was related to pain she was having in back. I advised patient that I will have doctor review over final report and advise. KW

## 2022-06-09 NOTE — Telephone Encounter (Signed)
Can you please review over labs from 06/06/22 and advise. Thanks. KW

## 2022-07-23 ENCOUNTER — Encounter: Payer: Self-pay | Admitting: Obstetrics & Gynecology

## 2022-07-23 NOTE — Progress Notes (Signed)
Subjective:     Janet Campos is a 61 y.o. female G2P2002, here for a routine exam.  Current complaints: frequent UTI, vag discharge..  Personal health questionnaire reviewed: yes.   Gynecologic History No LMP recorded. Patient is postmenopausal. Last Pap: 12/14/2020. Results were: normal Last mammogram: 03/03/2022. Results were: normal  Obstetric History OB History  Gravida Para Term Preterm AB Living  '2 2 2     2  '$ SAB IAB Ectopic Multiple Live Births          1    # Outcome Date GA Lbr Len/2nd Weight Sex Delivery Anes PTL Lv  2 Term 10/18/88   8 lb (3.629 kg) F Vag-Spont     1 Term 05/13/86   7 lb 4 oz (3.289 kg) M Vag-Spont  N LIV     The following portions of the patient's history were reviewed and updated as appropriate: allergies, current medications, past family history, past medical history, past social history, past surgical history, and problem list.  Review of Systems A comprehensive review of systems was negative.    Objective:    BP 102/66   Wt 129 lb 6.4 oz (58.7 kg)   BMI 22.92 kg/m   General Appearance:    Alert, cooperative, no distress, appears stated age  Head:    Normocephalic, without obvious abnormality, atraumatic  Eyes:    PERRL, conjunctiva/corneas clear, EOM's intact, fundi    benign, both eyes  Ears:    Normal TM's and external ear canals, both ears  Nose:   Nares normal, septum midline, mucosa normal, no drainage    or sinus tenderness  Throat:   Lips, mucosa, and tongue normal; teeth and gums normal  Neck:   Supple, symmetrical, trachea midline, no adenopathy;    thyroid:  no enlargement/tenderness/nodules; no carotid   bruit or JVD  Back:     Symmetric, no curvature, ROM normal, no CVA tenderness  Lungs:     Clear to auscultation bilaterally, respirations unlabored  Chest Wall:    No tenderness or deformity   Heart:    Regular rate and rhythm, S1 and S2 normal, no murmur, rub   or gallop  Breast Exam:    No tenderness, masses, or nipple  abnormality  Abdomen:     Soft, non-tender, bowel sounds active all four quadrants,    no masses, no organomegaly  Genitalia:    Normal female without lesion, discharge or tenderness  Rectal:    Normal tone, normal prostate, no masses or tenderness;   guaiac negative stool  Extremities:   Extremities normal, atraumatic, no cyanosis or edema  Pulses:   2+ and symmetric all extremities  Skin:   Skin color, texture, turgor normal, no rashes or lesions  Lymph nodes:   Cervical, supraclavicular, and axillary nodes normal  Neurologic:   CNII-XII intact, normal strength, sensation and reflexes    throughout      Assessment:    Healthy female exam.    Plan:  Cerveicovaginal ancillary  POCT   Urine culture Return as scheduled  Rosario Adie, MD  07/23/2022 4:14 PM

## 2022-11-24 ENCOUNTER — Ambulatory Visit: Payer: BC Managed Care – PPO | Admitting: Dermatology

## 2022-11-24 ENCOUNTER — Encounter: Payer: Self-pay | Admitting: Dermatology

## 2022-11-24 VITALS — BP 130/84 | HR 95

## 2022-11-24 DIAGNOSIS — Z79899 Other long term (current) drug therapy: Secondary | ICD-10-CM

## 2022-11-24 DIAGNOSIS — L700 Acne vulgaris: Secondary | ICD-10-CM | POA: Diagnosis not present

## 2022-11-24 DIAGNOSIS — Z86018 Personal history of other benign neoplasm: Secondary | ICD-10-CM

## 2022-11-24 DIAGNOSIS — Z1283 Encounter for screening for malignant neoplasm of skin: Secondary | ICD-10-CM | POA: Diagnosis not present

## 2022-11-24 DIAGNOSIS — D1801 Hemangioma of skin and subcutaneous tissue: Secondary | ICD-10-CM

## 2022-11-24 DIAGNOSIS — L738 Other specified follicular disorders: Secondary | ICD-10-CM | POA: Diagnosis not present

## 2022-11-24 DIAGNOSIS — K130 Diseases of lips: Secondary | ICD-10-CM

## 2022-11-24 DIAGNOSIS — L649 Androgenic alopecia, unspecified: Secondary | ICD-10-CM

## 2022-11-24 DIAGNOSIS — D2372 Other benign neoplasm of skin of left lower limb, including hip: Secondary | ICD-10-CM

## 2022-11-24 DIAGNOSIS — L409 Psoriasis, unspecified: Secondary | ICD-10-CM | POA: Diagnosis not present

## 2022-11-24 DIAGNOSIS — L578 Other skin changes due to chronic exposure to nonionizing radiation: Secondary | ICD-10-CM

## 2022-11-24 DIAGNOSIS — L821 Other seborrheic keratosis: Secondary | ICD-10-CM

## 2022-11-24 DIAGNOSIS — D229 Melanocytic nevi, unspecified: Secondary | ICD-10-CM

## 2022-11-24 DIAGNOSIS — L814 Other melanin hyperpigmentation: Secondary | ICD-10-CM

## 2022-11-24 MED ORDER — CLINDAMYCIN-TRETINOIN 1.2-0.025 % EX GEL: CUTANEOUS | 6 refills | Status: AC

## 2022-11-24 MED ORDER — MOMETASONE FUROATE 0.1 % EX SOLN: Freq: Every day | CUTANEOUS | 6 refills | Status: AC

## 2022-11-24 NOTE — Patient Instructions (Addendum)
Female Androgenic Alopecia is a chronic condition related to genetics and/or hormonal changes.  In women androgenetic alopecia is commonly associated with menopause but may occur any time after puberty.  It causes hair thinning primarily on the crown with widening of the part and temporal hairline recession.  Can use OTC Rogaine (minoxidil) 5% solution/foam as directed.  Oral treatments in female patients who have no contraindication may include : - Low dose oral minoxidil 1.25 - '5mg'$  daily - Spironolactone 50 - '100mg'$  bid - Finasteride 2.5 - 5 mg daily Adjunctive therapies include: - Low Level Laser Light Therapy (LLLT) - Platelet-rich plasma injections (PRP) - Hair Transplants or scalp reduction Recommend biotin supplement 2.5 mg capsule by mouth daily      Topical retinoid medications like tretinoin/Retin-A, adapalene/Differin, tazarotene/Fabior, and Epiduo/Epiduo Forte can cause dryness and irritation when first started. Only apply a pea-sized amount to the entire affected area. Avoid applying it around the eyes, edges of mouth and creases at the nose. If you experience irritation, use a good moisturizer first and/or apply the medicine less often. If you are doing well with the medicine, you can increase how often you use it until you are applying every night. Be careful with sun protection while using this medication as it can make you sensitive to the sun. This medicine should not be used by pregnant women.       Melanoma ABCDEs  Melanoma is the most dangerous type of skin cancer, and is the leading cause of death from skin disease.  You are more likely to develop melanoma if you: Have light-colored skin, light-colored eyes, or red or blond hair Spend a lot of time in the sun Tan regularly, either outdoors or in a tanning bed Have had blistering sunburns, especially during childhood Have a close family member who has had a melanoma Have atypical moles or large birthmarks  Early  detection of melanoma is key since treatment is typically straightforward and cure rates are extremely high if we catch it early.   The first sign of melanoma is often a change in a mole or a new dark spot.  The ABCDE system is a way of remembering the signs of melanoma.  A for asymmetry:  The two halves do not match. B for border:  The edges of the growth are irregular. C for color:  A mixture of colors are present instead of an even brown color. D for diameter:  Melanomas are usually (but not always) greater than 46m - the size of a pencil eraser. E for evolution:  The spot keeps changing in size, shape, and color.  Please check your skin once per month between visits. You can use a small mirror in front and a large mirror behind you to keep an eye on the back side or your body.   If you see any new or changing lesions before your next follow-up, please call to schedule a visit.  Please continue daily skin protection including broad spectrum sunscreen SPF 30+ to sun-exposed areas, reapplying every 2 hours as needed when you're outdoors.   Staying in the shade or wearing long sleeves, sun glasses (UVA+UVB protection) and wide brim hats (4-inch brim around the entire circumference of the hat) are also recommended for sun protection.    Due to recent changes in healthcare laws, you may see results of your pathology and/or laboratory studies on MyChart before the doctors have had a chance to review them. We understand that in some cases there  may be results that are confusing or concerning to you. Please understand that not all results are received at the same time and often the doctors may need to interpret multiple results in order to provide you with the best plan of care or course of treatment. Therefore, we ask that you please give [place] 2 business days to thoroughly review all your results before contacting the office for clarification. Should we see a critical lab result, you will be contacted  sooner.   If You Need Anything After Your Visit  If you have any questions or concerns for your doctor, please call our main line at [phone] and press option 4 to reach your doctor's medical assistant. If no one answers, please leave a voicemail as directed and we will return your call as soon as possible. Messages left after 4 pm will be answered the following business day.   You may also send [place] a message via [place]. We typically respond to MyChart messages within 1-2 business days.  For prescription refills, please ask your pharmacy to contact our office. Our fax number is [phone].  If you have an urgent issue when the clinic is closed that cannot wait until the next business day, you can page your doctor at the number below.    Please note that while we do our best to be available for urgent issues outside of office hours, we are not available 24/7.   If you have an urgent issue and are unable to reach [place], you may choose to seek medical care at your doctor's office, retail clinic, urgent care center, or emergency room.  If you have a medical emergency, please immediately call 911 or go to the emergency department.  Pager Numbers  - Dr. Nehemiah Massed: [phone]  - Dr. Laurence Ferrari: [phone]  - Dr. Nicole Kindred: [phone]  In the event of inclement weather, please call our main line at [phone] for an update on the status of any delays or closures.  Dermatology Medication Tips: Please keep the boxes that topical medications come in in order to help keep track of the instructions about where and how to use these. Pharmacies typically print the medication instructions only on the boxes and not directly on the medication tubes.   If your medication is too expensive, please contact our office at [phone] option 4 or send [place] a message through [place].   We are unable to tell what your co-pay for medications will be in advance as this is different depending on your insurance  coverage. However, we may be able to find a substitute medication at lower cost or fill out paperwork to get insurance to cover a needed medication.   If a prior authorization is required to get your medication covered by your insurance company, please allow [place] 1-2 business days to complete this process.  Drug prices often vary depending on where the prescription is filled and some pharmacies may offer cheaper prices.  The website www.goodrx.com contains coupons for medications through different pharmacies. The prices here do not account for what the cost may be with help from insurance (it may be cheaper with your insurance), but the website can give you the price if you did not use any insurance.  - You can print the associated coupon and take it with your prescription to the pharmacy.  - You may also stop by our office during regular business hours and pick up a GoodRx coupon card.  - If you need your prescription sent electronically to  a different pharmacy, notify our office through [facility] or by phone at [phone] option 4.     Si Usted Necesita Algo Despus de Su Visita  Tambin puede enviarnos un mensaje a travs de Pharmacist, community. Por lo general respondemos a los mensajes de MyChart en el transcurso de 1 a 2 das hbiles.  Para renovar recetas, por favor pida a su farmacia que se ponga en contacto con nuestra oficina. Harland Dingwall de fax es [place] [phone].  Si tiene un asunto urgente cuando la clnica est cerrada y que no puede esperar hasta el siguiente da hbil, puede llamar/localizar a su doctor(a) al nmero que aparece a continuacin.   Por favor, tenga en cuenta que aunque hacemos todo lo posible para estar disponibles para asuntos urgentes fuera del horario de [place], no estamos disponibles las 24 horas del da, los 7 das de la [place].   Si tiene un problema urgente y no puede comunicarse con nosotros, puede optar por buscar atencin mdica  en el consultorio de  su doctor(a), en una clnica privada, en un centro de atencin urgente o en una sala de emergencias.  Si tiene Engineering geologist, por favor llame inmediatamente al 911 o vaya a la sala de emergencias.  Nmeros de bper  - Dr. Nehemiah Massed: [phone]  - Dra. Moye: [phone]  - Dra. Nicole Kindred: [phone]  En caso de inclemencias del [place], por favor llame a Johnsie Kindred principal al [phone] para una actualizacin sobre el [place] de cualquier retraso o cierre.  Consejos para la medicacin en dermatologa: Por favor, guarde las cajas en las que vienen los medicamentos de uso tpico para ayudarle a seguir las instrucciones sobre dnde y cmo usarlos. Las farmacias generalmente imprimen las instrucciones del medicamento slo en las cajas y no directamente en los tubos del [place].   Si su medicamento es muy caro, por favor, pngase en contacto con Zigmund Daniel llamando al [phone] y presione la opcin 4 o envenos un mensaje a travs de Pharmacist, community.   No podemos decirle cul ser su copago por los medicamentos por adelantado ya que esto es diferente dependiendo de la cobertura de su seguro. Sin embargo, es posible que podamos encontrar un medicamento sustituto a Electrical engineer un formulario para que el seguro cubra el medicamento que se considera necesario.   Si se requiere una autorizacin previa para que su compaa de seguros [place] su medicamento, por favor permtanos de 1 a 2 das hbiles para completar este proceso.  Los precios de los medicamentos varan con frecuencia dependiendo del Environmental consultant de dnde se surte la receta y alguna farmacias pueden ofrecer precios ms baratos.  El sitio web www.goodrx.com tiene cupones para medicamentos de Airline pilot. Los precios aqu no tienen en cuenta lo que podra costar con la ayuda del seguro (puede ser ms barato con su seguro), pero el sitio web puede darle el precio si no utiliz Research scientist (physical sciences).  - Puede imprimir el  cupn correspondiente y llevarlo con su receta a la farmacia.  - Tambin puede pasar por nuestra oficina durante el horario de atencin regular y Charity fundraiser una tarjeta de cupones de GoodRx.  - Si necesita que su receta se enve electrnicamente a una farmacia diferente, informe a nuestra oficina a travs de MyChart de [place] o por telfono llamando al [phone] y presione la opcin 4.

## 2022-11-24 NOTE — Progress Notes (Signed)
Follow-Up Visit   Subjective  Janet Campos is a 62 y.o. female who presents for the following: Annual Exam (1 year tbse, hx of sebaceous hyperplasia, hx of acne, hx of psoriasis scalp and eyebrows using mometasone solution. Patient is concerned today with hair loss and clogged pores at nose. Would like to discuss treatment options. ). The patient presents for Total-Body Skin Exam (TBSE) for skin cancer screening and mole check.  The patient has spots, moles and lesions to be evaluated, some may be new or changing and the patient has concerns that these could be cancer.  The following portions of the chart were reviewed this encounter and updated as appropriate:  Tobacco  Allergies  Meds  Problems  Med Hx  Surg Hx  Fam Hx     Review of Systems: No other skin or systemic complaints except as noted in HPI or Assessment and Plan.  Objective  Well appearing patient in no apparent distress; mood and affect are within normal limits.  A full examination was performed including scalp, head, eyes, ears, nose, lips, neck, chest, axillae, abdomen, back, buttocks, bilateral upper extremities, bilateral lower extremities, hands, feet, fingers, toes, fingernails, and toenails. All findings within normal limits unless otherwise noted below.  Head - Anterior (Face) 5 closed comedones at nose   Assessment & Plan  Sebaceous hyperplasia of face face Benign, observe. Can apply clindamycin - tretinoin gel - apply topically at bedtime Topical retinoid medications like tretinoin/Retin-A, adapalene/Differin, tazarotene/Fabior, and Epiduo/Epiduo Forte can cause dryness and irritation when first started. Only apply a pea-sized amount to the entire affected area. Avoid applying it around the eyes, edges of mouth and creases at the nose. If you experience irritation, use a good moisturizer first and/or apply the medicine less often. If you are doing well with the medicine, you can increase how often you use  it until you are applying every night. Be careful with sun protection while using this medication as it can make you sensitive to the sun. This medicine should not be used by pregnant women.   Acne vulgaris Head - Anterior (Face) Chronic; persistent but improved Continue Clindamycin-tretinoin gel apply to face daily and nose Topical retinoid medications like tretinoin/Retin-A, adapalene/Differin, tazarotene/Fabior, and Epiduo/Epiduo Forte can cause dryness and irritation when first started. Only apply a pea-sized amount to the entire affected area. Avoid applying it around the eyes, edges of mouth and creases at the nose. If you experience irritation, use a good moisturizer first and/or apply the medicine less often. If you are doing well with the medicine, you can increase how often you use it until you are applying every night. Be careful with sun protection while using this medication as it can make you sensitive to the sun. This medicine should not be used by pregnant women.   clindamycin-tretinoin (VELTIN) gel - Head - Anterior (Face) Use pea sized amount to face and nose at bedtime.  Psoriasis scalp and eyebrows Counseling on psoriasis and coordination of care  psoriasis is a chronic non-curable, but treatable genetic/hereditary disease that may have other systemic features affecting other organ systems such as joints (Psoriatic Arthritis). It is associated with an increased risk of inflammatory bowel disease, heart disease, non-alcoholic fatty liver disease, and depression.  Treatments include light and laser treatments; topical medications; and systemic medications including oral and injectables.  Cont Mometasone solution apply to affected skin qd-bid prn flares   Related Medications mometasone (ELOCON) 0.1 % lotion Apply topically daily. Avoid applying to  face, groin, and axilla. Use as directed. Risk of skin atrophy with long-term use reviewed.  Cheilitis lips Continue lip balm as  directed, Dr. 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  In women androgenetic alopecia is commonly associated with menopause but may occur any time after puberty.  It causes hair thinning primarily on the crown with widening of the part and temporal hairline recession.  Can use OTC Rogaine (minoxidil) 5% solution/foam as directed.  Oral treatments in female patients who have no contraindication may include : - Low dose oral minoxidil 1.25 - '5mg'$  daily - Spironolactone 50 - '100mg'$  bid - Finasteride 2.5 - 5 mg daily Adjunctive therapies include: - Low Level Laser Light Therapy (LLLT) - Platelet-rich plasma injections (PRP) - Hair Transplants or scalp reduction  Patient deferred starting any treatments today. Will consider.  Recommend starting Biotin 2.5 mg capsule by mouth daily  Lentigines - Scattered tan macules - Due to sun exposure - Benign-appearing, observe - Recommend daily broad spectrum sunscreen SPF 30+ to sun-exposed areas, reapply every 2 hours as needed. - Call for any changes  Seborrheic Keratoses - Stuck-on, waxy, tan-brown papules and/or plaques  - Benign-appearing - Discussed benign etiology and prognosis. - Observe - Call for any changes  Keratosis Pilaris Arms and legs - Tiny follicular keratotic papules - Benign. Genetic in nature. No cure. - Observe. - If desired, patient can use an emollient (moisturizer) containing ammonium lactate, urea or salicylic acid once a day to smooth the area  Dermatofibroma Left knee area - Firm pink/brown papulenodule with dimple sign - Benign appearing - Call for any changes  Melanocytic Nevi - Tan-brown and/or pink-flesh-colored symmetric macules and papules - Benign  appearing on exam today - Observation - Call clinic for new or changing moles - Recommend daily use of broad spectrum spf 30+ sunscreen to sun-exposed areas.   Hemangiomas - Red papules - Discussed benign nature - Observe - Call for any changes  Actinic Damage - Chronic condition, secondary to cumulative UV/sun exposure - diffuse scaly erythematous macules with underlying dyspigmentation - Recommend daily broad spectrum sunscreen SPF 30+ to sun-exposed areas, reapply every 2 hours as needed.  - Staying in the shade or wearing long sleeves, sun glasses (UVA+UVB protection) and wide brim hats (4-inch brim around the entire circumference of the hat) are also recommended for sun protection.  - Call for new or changing lesions.  History of Dysplastic Nevi Left posterior ear 11/16/2020 Right lateral to crown scalp/mod 11/11/19 - No evidence of recurrence today - Recommend regular full body skin exams - Recommend daily broad spectrum sunscreen SPF 30+ to sun-exposed areas, reapply every 2 hours as needed.  - Call if any new or changing lesions are noted between office visits  Skin cancer screening performed today.  Return in about 1 year (around 11/25/2023) for TBSE.  IRuthell Rummage, CMA, am acting as scribe for Sarina Ser, MD. Documentation: I have reviewed the above documentation for accuracy and completeness, and I agree with the above.  Sarina Ser, MD

## 2022-11-28 ENCOUNTER — Encounter: Payer: Self-pay | Admitting: Dermatology

## 2022-12-27 ENCOUNTER — Other Ambulatory Visit: Payer: Self-pay | Admitting: Obstetrics and Gynecology

## 2022-12-27 DIAGNOSIS — Z1231 Encounter for screening mammogram for malignant neoplasm of breast: Secondary | ICD-10-CM

## 2023-03-06 ENCOUNTER — Ambulatory Visit
Admission: RE | Admit: 2023-03-06 | Discharge: 2023-03-06 | Disposition: A | Discharge status: Home / Self Care | Payer: BC Managed Care – PPO | Source: Ambulatory Visit | Attending: Obstetrics and Gynecology | Admitting: Obstetrics and Gynecology

## 2023-03-06 DIAGNOSIS — Z1231 Encounter for screening mammogram for malignant neoplasm of breast: Secondary | ICD-10-CM | POA: Insufficient documentation

## 2023-06-20 ENCOUNTER — Encounter: Payer: Self-pay | Admitting: Dermatology

## 2023-06-20 ENCOUNTER — Ambulatory Visit: Payer: BC Managed Care – PPO | Admitting: Dermatology

## 2023-06-20 VITALS — BP 131/84 | HR 80

## 2023-06-20 DIAGNOSIS — S80861A Insect bite (nonvenomous), right lower leg, initial encounter: Secondary | ICD-10-CM

## 2023-06-20 DIAGNOSIS — S70361A Insect bite (nonvenomous), right thigh, initial encounter: Secondary | ICD-10-CM | POA: Diagnosis not present

## 2023-06-20 DIAGNOSIS — D2362 Other benign neoplasm of skin of left upper limb, including shoulder: Secondary | ICD-10-CM

## 2023-06-20 DIAGNOSIS — S40862A Insect bite (nonvenomous) of left upper arm, initial encounter: Secondary | ICD-10-CM

## 2023-06-20 DIAGNOSIS — S80862A Insect bite (nonvenomous), left lower leg, initial encounter: Secondary | ICD-10-CM | POA: Diagnosis not present

## 2023-06-20 DIAGNOSIS — S40861A Insect bite (nonvenomous) of right upper arm, initial encounter: Secondary | ICD-10-CM | POA: Diagnosis not present

## 2023-06-20 DIAGNOSIS — W57XXXA Bitten or stung by nonvenomous insect and other nonvenomous arthropods, initial encounter: Secondary | ICD-10-CM

## 2023-06-20 NOTE — Patient Instructions (Addendum)
Recommend Off bug repellent such as [place] OFF with DEET. Ok to apply to skin before working or spending times outside  If still itchy bumps after several weeks send [employer] or call we can bring you back to evaluate.       Due to recent changes in healthcare laws, you may see results of your pathology and/or laboratory studies on MyChart before the doctors have had a chance to review them. We understand that in some cases there may be results that are confusing or concerning to you. Please understand that not all results are received at the same time and often the doctors may need to interpret multiple results in order to provide you with the best plan of care or course of treatment. Therefore, we ask that you please give [place] 2 business days to thoroughly review all your results before contacting the office for clarification. Should we see a critical lab result, you will be contacted sooner.   If You Need Anything After Your Visit  If you have any questions or concerns for your doctor, please call our main line at [phone] and press option 4 to reach your doctor's medical assistant. If no one answers, please leave a voicemail as directed and we will return your call as soon as possible. Messages left after 4 pm will be answered the following business day.   You may also send [place] a message via MyChart. We typically respond to MyChart messages within 1-2 business days.  For prescription refills, please ask your pharmacy to contact our office. Our fax number is [phone].  If you have an urgent issue when the clinic is closed that cannot wait until the next business day, you can page your doctor at the number below.    Please note that while we do our best to be available for urgent issues outside of office hours, we are not available 24/7.   If you have an urgent issue and are unable to reach [place], you may choose to seek medical care at your doctor's office, retail clinic, urgent  care center, or emergency room.  If you have a medical emergency, please immediately call 911 or go to the emergency department.  Pager Numbers  - Dr. Gwen Pounds: [phone]  - Dr. Roseanne Reno: [phone]  - Dr. Katrinka Blazing: [phone]   In the event of inclement weather, please call our main line at [phone] for an update on the status of any delays or closures.  Dermatology Medication Tips: Please keep the boxes that topical medications come in in order to help keep track of the instructions about where and how to use these. Pharmacies typically print the medication instructions only on the boxes and not directly on the medication tubes.   If your medication is too expensive, please contact our office at [phone] option 4 or send [place] a message through MyChart.   We are unable to tell what your co-pay for medications will be in advance as this is different depending on your insurance coverage. However, we may be able to find a substitute medication at lower cost or fill out paperwork to get insurance to cover a needed medication.   If a prior authorization is required to get your medication covered by your insurance company, please allow [place] 1-2 business days to complete this process.  Drug prices often vary depending on where the prescription is filled and some pharmacies may offer cheaper prices.  The website www.goodrx.com contains coupons for medications through different pharmacies. The  prices here do not account for what the cost may be with help from insurance (it may be cheaper with your insurance), but the website can give you the price if you did not use any insurance.  - You can print the associated coupon and take it with your prescription to the pharmacy.  - You may also stop by our office during regular business hours and pick up a GoodRx coupon card.  - If you need your prescription sent electronically to a different pharmacy, notify our office through [facility]  or by phone at [phone] option 4.     Si Usted Necesita Algo Despus de Su Visita  Tambin puede enviarnos un mensaje a travs de Clinical cytogeneticist. Por lo general respondemos a los mensajes de MyChart en el transcurso de 1 a 2 das hbiles.  Para renovar recetas, por favor pida a su farmacia que se ponga en contacto con nuestra oficina. Annie Sable de fax es [place] [phone].  Si tiene un asunto urgente cuando la clnica est cerrada y que no puede esperar hasta el siguiente da hbil, puede llamar/localizar a su doctor(a) al nmero que aparece a continuacin.   Por favor, tenga en cuenta que aunque hacemos todo lo posible para estar disponibles para asuntos urgentes fuera del horario de [place], no estamos disponibles las 24 horas del da, los 7 [address]  [address] de la [place].   Si tiene un problema urgente y no puede comunicarse con nosotros, puede optar por buscar atencin mdica  en el consultorio de su doctor(a), en una clnica privada, en un centro de atencin urgente o en una sala de emergencias.  Si tiene Engineer, drilling, por favor llame inmediatamente al 911 o vaya a la sala de emergencias.  Nmeros de bper  - Dr. Gwen Pounds: [phone]  - Dra. Roseanne Reno: [phone]  - Dr. Katrinka Blazing: [phone]   En caso de inclemencias del tiempo, por favor llame a Lacy Duverney principal al [phone] para una actualizacin sobre el [place] de cualquier retraso o cierre.  Consejos para la medicacin en dermatologa: Por favor, guarde las cajas en las que vienen los medicamentos de uso tpico para ayudarle a seguir las instrucciones sobre dnde y cmo usarlos. Las farmacias generalmente imprimen las instrucciones del medicamento slo en las cajas y no directamente en los tubos del [place].   Si su medicamento es muy caro, por favor, pngase en contacto con Rolm Gala llamando al [phone] y presione la opcin 4 o envenos un mensaje a travs de Clinical cytogeneticist.   No podemos decirle cul ser su  copago por los medicamentos por adelantado ya que esto es diferente dependiendo de la cobertura de su seguro. Sin embargo, es posible que podamos encontrar un medicamento sustituto a Audiological scientist un formulario para que el seguro cubra el medicamento que se considera necesario.   Si se requiere una autorizacin previa para que su compaa de seguros [place] su medicamento, por favor permtanos de 1 a 2 das hbiles para completar [address]  Los precios de los medicamentos varan con frecuencia dependiendo del Environmental consultant de dnde se surte la receta y alguna farmacias pueden ofrecer precios ms baratos.  El sitio web www.goodrx.com tiene cupones para medicamentos de Health and safety inspector. Los precios aqu no tienen en cuenta lo que podra costar con la ayuda del seguro (puede ser ms barato con su seguro), pero el sitio web puede darle el precio si no utiliz Tourist information centre manager.  - Puede imprimir el cupn correspondiente y llevarlo con su receta  a la farmacia.  - Tambin puede pasar por nuestra oficina durante el horario de atencin regular y Education officer, museum una tarjeta de cupones de GoodRx.  - Si necesita que su receta se enve electrnicamente a una farmacia diferente, informe a nuestra oficina a travs de MyChart de [place] o por telfono llamando al [phone] y presione la opcin 4.

## 2023-06-20 NOTE — Progress Notes (Signed)
   Follow-Up Visit   Subjective  Janet Campos is a 62 y.o. female who presents for the following: patient reports itchy bumps at legs, feet, abdomen, and b/l arms. Started 1 - 2 weeks ago after working outside cutting grass. Concerned she has bug bites. Does yard work and wears Insurance underwriter. Had exterminator check house for bed bugs.  Also reports growths under skin at left arm that are sometimes itchy.    The patient has spots, moles and lesions to be evaluated, some may be new or changing and the patient may have concern these could be cancer.   The following portions of the chart were reviewed this encounter and updated as appropriate: medications, allergies, medical history  Review of Systems:  No other skin or systemic complaints except as noted in HPI or Assessment and Plan.  Objective  Well appearing patient in no apparent distress; mood and affect are within normal limits.    A focused examination was performed of the following areas: Right flank, b/l legs, b/l legs, right thigh, b/l arms   Relevant exam findings are noted in the Assessment and Plan.  right thigh, b/l legs, b/l arms Pink inflammatory papules on left proximal medial forearm, right flank, left dorsal foot, right lower leg    Assessment & Plan    Bug bite without infection, initial encounter right thigh, b/l legs, b/l arms  Concerned with itchy bumps 1 - 2 weeks  Lesions consistent with arthropod bites. No evidence of scabies. Cannot determine etiology of insects based on bites Discussed topical steriod to clear lesions Patient deferred steriod, would like to continue using otc hydrocortisone cream: apply BID until resolved Recommend insect repellant with DEET such as OFF! [place] If still persistent in a month recommend call or mychart message     Benign Growth at left forearm  Exam: mobile semi-firm compressible subcutaneous nodule on left mid forearm  Treatment Plan: Present and  unchanging for years, per patient report. Likely benign. Requires excisional biopsy to characterize. Patient prefers observation   Return if symptoms worsen or fail to improve.  I, Asher Muir, CMA, am acting as scribe for Elie Goody, MD.   Documentation: I have reviewed the above documentation for accuracy and completeness, and I agree with the above.  Elie Goody, MD

## 2023-12-07 ENCOUNTER — Ambulatory Visit: Payer: BC Managed Care – PPO | Admitting: Dermatology

## 2023-12-11 ENCOUNTER — Ambulatory Visit: Payer: 59 | Admitting: Dermatology

## 2023-12-11 ENCOUNTER — Encounter: Payer: Self-pay | Admitting: Dermatology

## 2023-12-11 DIAGNOSIS — Z7189 Other specified counseling: Secondary | ICD-10-CM

## 2023-12-11 DIAGNOSIS — Z79899 Other long term (current) drug therapy: Secondary | ICD-10-CM

## 2023-12-11 DIAGNOSIS — L578 Other skin changes due to chronic exposure to nonionizing radiation: Secondary | ICD-10-CM

## 2023-12-11 DIAGNOSIS — Z1283 Encounter for screening for malignant neoplasm of skin: Secondary | ICD-10-CM

## 2023-12-11 DIAGNOSIS — L649 Androgenic alopecia, unspecified: Secondary | ICD-10-CM

## 2023-12-11 DIAGNOSIS — D224 Melanocytic nevi of scalp and neck: Secondary | ICD-10-CM

## 2023-12-11 DIAGNOSIS — D489 Neoplasm of uncertain behavior, unspecified: Secondary | ICD-10-CM

## 2023-12-11 DIAGNOSIS — D2372 Other benign neoplasm of skin of left lower limb, including hip: Secondary | ICD-10-CM

## 2023-12-11 DIAGNOSIS — D229 Melanocytic nevi, unspecified: Secondary | ICD-10-CM

## 2023-12-11 DIAGNOSIS — D1801 Hemangioma of skin and subcutaneous tissue: Secondary | ICD-10-CM

## 2023-12-11 DIAGNOSIS — L700 Acne vulgaris: Secondary | ICD-10-CM | POA: Diagnosis not present

## 2023-12-11 DIAGNOSIS — L810 Postinflammatory hyperpigmentation: Secondary | ICD-10-CM

## 2023-12-11 DIAGNOSIS — D492 Neoplasm of unspecified behavior of bone, soft tissue, and skin: Secondary | ICD-10-CM

## 2023-12-11 DIAGNOSIS — L821 Other seborrheic keratosis: Secondary | ICD-10-CM

## 2023-12-11 DIAGNOSIS — L409 Psoriasis, unspecified: Secondary | ICD-10-CM

## 2023-12-11 DIAGNOSIS — W908XXA Exposure to other nonionizing radiation, initial encounter: Secondary | ICD-10-CM

## 2023-12-11 DIAGNOSIS — D2239 Melanocytic nevi of other parts of face: Secondary | ICD-10-CM

## 2023-12-11 DIAGNOSIS — D239 Other benign neoplasm of skin, unspecified: Secondary | ICD-10-CM

## 2023-12-11 DIAGNOSIS — L819 Disorder of pigmentation, unspecified: Secondary | ICD-10-CM

## 2023-12-11 DIAGNOSIS — L858 Other specified epidermal thickening: Secondary | ICD-10-CM

## 2023-12-11 DIAGNOSIS — L814 Other melanin hyperpigmentation: Secondary | ICD-10-CM

## 2023-12-11 MED ORDER — CLINDAMYCIN PHOSPHATE 1 % EX LOTN: TOPICAL_LOTION | Freq: Every day | CUTANEOUS | 11 refills | Status: AC

## 2023-12-11 MED ORDER — TRETINOIN 0.025 % EX CREA: TOPICAL_CREAM | CUTANEOUS | 11 refills | Status: AC

## 2023-12-11 NOTE — Patient Instructions (Addendum)
Recommend cerave cream to elbow, Amlactin rapid relief to areas  Eucerin with Urea 10 % cream all found over the counter  to help     Biopsy Wound Care Instructions  Leave the original bandage on for 24 hours if possible.  If the bandage becomes soaked or soiled before that time, it is OK to remove it and examine the wound.  A small amount of post-operative bleeding is normal.  If excessive bleeding occurs, remove the bandage, place gauze over the site and apply continuous pressure (no peeking) over the area for 30 minutes. If this does not work, please call our clinic as soon as possible or page your doctor if it is after hours.   Once a day, cleanse the wound with soap and water. It is fine to shower. If a thick crust develops you may use a Q-tip dipped into dilute hydrogen peroxide (mix 1:1 with water) to dissolve it.  Hydrogen peroxide can slow the healing process, so use it only as needed.    After washing, apply petroleum jelly (Vaseline) or an antibiotic ointment if your doctor prescribed one for you, followed by a bandage.    For best healing, the wound should be covered with a layer of ointment at all times. If you are not able to keep the area covered with a bandage to hold the ointment in place, this may mean re-applying the ointment several times a day.  Continue this wound care until the wound has healed and is no longer open.   Itching and mild discomfort is normal during the healing process. However, if you develop pain or severe itching, please call our office.   If you have any discomfort, you can take Tylenol (acetaminophen) or ibuprofen as directed on the bottle. (Please do not take these if you have an allergy to them or cannot take them for another reason).  Some redness, tenderness and white or yellow material in the wound is normal healing.  If the area becomes very sore and red, or develops a thick yellow-green material (pus), it may be infected; please notify us.    If  you have stitches, return to clinic as directed to have the stitches removed. You will continue wound care for 2-3 days after the stitches are removed.   Wound healing continues for up to one year following surgery. It is not unusual to experience pain in the scar from time to time during the interval.  If the pain becomes severe or the scar thickens, you should notify the office.    A slight amount of redness in a scar is expected for the first six months.  After six months, the redness will fade and the scar will soften and fade.  The color difference becomes less noticeable with time.  If there are any problems, return for a post-op surgery check at your earliest convenience.  To improve the appearance of the scar, you can use silicone scar gel, cream, or sheets (such as Mederma or Serica) every night for up to one year. These are available over the counter (without a prescription).  Please call our office at [phone] for any questions or concerns.      Female Androgenic Alopecia is a chronic condition related to genetics and/or hormonal changes.  In women androgenetic alopecia is commonly associated with menopause but may occur any time after puberty.  It causes hair thinning primarily on the crown with widening of the part and temporal hairline recession.  Can use  OTC Rogaine (minoxidil) 5% solution/foam as directed.  Oral treatments in female patients who have no contraindication may include : Recommend biotin 2.5 mg by mouth daily if bottle container higher  - Low dose oral minoxidil 1.25 - 5mg  daily - Spironolactone 50 - 100mg  bid - Finasteride 2.5 - 5 mg daily Adjunctive therapies include: - Low Level Laser Light Therapy (LLLT) - Platelet-rich plasma injections (PRP) - Hair Transplants or scalp reduction      Melanoma ABCDEs  Melanoma is the most dangerous type of skin cancer, and is the leading cause of death from skin disease.  You are more likely to develop melanoma if  you: Have light-colored skin, light-colored eyes, or red or blond hair Spend a lot of time in the sun Tan regularly, either outdoors or in a tanning bed Have had blistering sunburns, especially during childhood Have a close family member who has had a melanoma Have atypical moles or large birthmarks  Early detection of melanoma is key since treatment is typically straightforward and cure rates are extremely high if we catch it early.   The first sign of melanoma is often a change in a mole or a new dark spot.  The ABCDE system is a way of remembering the signs of melanoma.  A for asymmetry:  The two halves do not match. B for border:  The edges of the growth are irregular. C for color:  A mixture of colors are present instead of an even brown color. D for diameter:  Melanomas are usually (but not always) greater than 6mm - the size of a pencil eraser. E for evolution:  The spot keeps changing in size, shape, and color.  Please check your skin once per month between visits. You can use a small mirror in front and a large mirror behind you to keep an eye on the back side or your body.   If you see any new or changing lesions before your next follow-up, please call to schedule a visit.  Please continue daily skin protection including broad spectrum sunscreen SPF 30+ to sun-exposed areas, reapplying every 2 hours as needed when you're outdoors.   Staying in the shade or wearing long sleeves, sun glasses (UVA+UVB protection) and wide brim hats (4-inch brim around the entire circumference of the hat) are also recommended for sun protection.      Due to recent changes in healthcare laws, you may see results of your pathology and/or laboratory studies on MyChart before the doctors have had a chance to review them. We understand that in some cases there may be results that are confusing or concerning to you. Please understand that not all results are received at the same time and often the doctors  may need to interpret multiple results in order to provide you with the best plan of care or course of treatment. Therefore, we ask that you please give [place] 2 business days to thoroughly review all your results before contacting the office for clarification. Should we see a critical lab result, you will be contacted sooner.   If You Need Anything After Your Visit  If you have any questions or concerns for your doctor, please call our main line at [phone] and press option 4 to reach your doctor's medical assistant. If no one answers, please leave a voicemail as directed and we will return your call as soon as possible. Messages left after 4 pm will be answered the following business day.   You may also send  [place] a message via MyChart. We typically respond to MyChart messages within 1-2 business days.  For prescription refills, please ask your pharmacy to contact our office. Our fax number is [phone].  If you have an urgent issue when the clinic is closed that cannot wait until the next business day, you can page your doctor at the number below.    Please note that while we do our best to be available for urgent issues outside of office hours, we are not available 24/7.   If you have an urgent issue and are unable to reach [place], you may choose to seek medical care at your doctor's office, retail clinic, urgent care center, or emergency room.  If you have a medical emergency, please immediately call 911 or go to the emergency department.  Pager Numbers  - Dr. Gwen Pounds: [phone]  - Dr. Roseanne Reno: [phone]  - Dr. Katrinka Blazing: [phone]   In the event of inclement weather, please call our main line at [phone] for an update on the status of any delays or closures.  Dermatology Medication Tips: Please keep the boxes that topical medications come in in order to help keep track of the instructions about where and how to use these. Pharmacies typically print the medication  instructions only on the boxes and not directly on the medication tubes.   If your medication is too expensive, please contact our office at [phone] option 4 or send [place] a message through MyChart.   We are unable to tell what your co-pay for medications will be in advance as this is different depending on your insurance coverage. However, we may be able to find a substitute medication at lower cost or fill out paperwork to get insurance to cover a needed medication.   If a prior authorization is required to get your medication covered by your insurance company, please allow [place] 1-2 business days to complete this process.  Drug prices often vary depending on where the prescription is filled and some pharmacies may offer cheaper prices.  The website www.goodrx.com contains coupons for medications through different pharmacies. The prices here do not account for what the cost may be with help from insurance (it may be cheaper with your insurance), but the website can give you the price if you did not use any insurance.  - You can print the associated coupon and take it with your prescription to the pharmacy.  - You may also stop by our office during regular business hours and pick up a GoodRx coupon card.  - If you need your prescription sent electronically to a different pharmacy, notify our office through [facility] or by phone at [phone] option 4.     Si Usted Necesita Algo Despus de Su Visita  Tambin puede enviarnos un mensaje a travs de Clinical cytogeneticist. Por lo general respondemos a los mensajes de MyChart en el transcurso de 1 a 2 das hbiles.  Para renovar recetas, por favor pida a su farmacia que se ponga en contacto con nuestra oficina. Annie Sable de fax es [place] [phone].  Si tiene un asunto urgente cuando la clnica est cerrada y que no puede esperar hasta el siguiente da hbil, puede llamar/localizar a su doctor(a) al nmero que aparece a continuacin.   Por  favor, tenga en cuenta que aunque hacemos todo lo posible para estar disponibles para asuntos urgentes fuera del horario de [place], no estamos disponibles las 24 horas del da, los 7 [address]  [address] de la [place].   Si tiene  un problema urgente y no puede comunicarse con nosotros, puede optar por buscar atencin mdica  en el consultorio de su doctor(a), en una clnica privada, en un centro de atencin urgente o en una sala de emergencias.  Si tiene Engineer, drilling, por favor llame inmediatamente al 911 o vaya a la sala de emergencias.  Nmeros de bper  - Dr. Gwen Pounds: [phone]  - Dra. Roseanne Reno: [phone]  - Dr. Katrinka Blazing: [phone]   En caso de inclemencias del tiempo, por favor llame a Lacy Duverney principal al [phone] para una actualizacin sobre el [place] de cualquier retraso o cierre.  Consejos para la medicacin en dermatologa: Por favor, guarde las cajas en las que vienen los medicamentos de uso tpico para ayudarle a seguir las instrucciones sobre dnde y cmo usarlos. Las farmacias generalmente imprimen las instrucciones del medicamento slo en las cajas y no directamente en los tubos del [place].   Si su medicamento es muy caro, por favor, pngase en contacto con Rolm Gala llamando al [phone] y presione la opcin 4 o envenos un mensaje a travs de Clinical cytogeneticist.   No podemos decirle cul ser su copago por los medicamentos por adelantado ya que esto es diferente dependiendo de la cobertura de su seguro. Sin embargo, es posible que podamos encontrar un medicamento sustituto a Audiological scientist un formulario para que el seguro cubra el medicamento que se considera necesario.   Si se requiere una autorizacin previa para que su compaa de seguros [place] su medicamento, por favor permtanos de 1 a 2 das hbiles para completar [address]  Los precios de los medicamentos varan con frecuencia dependiendo del Environmental consultant de dnde se surte la receta y alguna farmacias  pueden ofrecer precios ms baratos.  El sitio web www.goodrx.com tiene cupones para medicamentos de Health and safety inspector. Los precios aqu no tienen en cuenta lo que podra costar con la ayuda del seguro (puede ser ms barato con su seguro), pero el sitio web puede darle el precio si no utiliz Tourist information centre manager.  - Puede imprimir el cupn correspondiente y llevarlo con su receta a la farmacia.  - Tambin puede pasar por nuestra oficina durante el horario de atencin regular y Education officer, museum una tarjeta de cupones de GoodRx.  - Si necesita que su receta se enve electrnicamente a una farmacia diferente, informe a nuestra oficina a travs de MyChart de [place] o por telfono llamando al [phone] y presione la opcin 4.

## 2023-12-11 NOTE — Progress Notes (Signed)
Follow-Up Visit   Subjective  Janet Campos is a 63 y.o. female who presents for the following: Skin Cancer Screening and Full Body Skin Exam Patient reports a spot above left ear that we have been watching, would just like it rechecked. Also a spot at left temple / hairline she would like checked.   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 Hx of acne was using clindamycin / tretinoin mix cream but was unable to get covered by insurance would like to discuss alternative treatment.  The patient presents for Total-Body Skin Exam (TBSE) for skin cancer screening and mole check. The patient has spots, moles and lesions to be evaluated, some may be new or changing and the patient may have concern these could be cancer.  The following portions of the chart were reviewed this encounter and updated as appropriate: medications, allergies, medical history  Review of Systems:  No other skin or systemic complaints except as noted in HPI or Assessment and Plan.  Objective  Well appearing patient in no apparent distress; mood and affect are within normal limits.  A full examination was performed including scalp, head, eyes, ears, nose, lips, neck, chest, axillae, abdomen, back, buttocks, bilateral upper extremities, bilateral lower extremities, hands, feet, fingers, toes, fingernails, and toenails. All findings within normal limits unless otherwise noted below.   Relevant physical exam findings are noted in the Assessment and Plan.      right lateral to crown scalp 0.6 cm brown macule in site of previous dysplastic nevus removal   Assessment & Plan   SKIN CANCER SCREENING PERFORMED TODAY.  ACTINIC DAMAGE - Chronic condition, secondary to cumulative UV/sun exposure - diffuse scaly erythematous macules with underlying dyspigmentation - Recommend daily broad spectrum sunscreen SPF 30+ to sun-exposed areas, reapply every 2 hours as needed.  - Staying in the  shade or wearing long sleeves, sun glasses (UVA+UVB protection) and wide brim hats (4-inch brim around the entire circumference of the hat) are also recommended for sun protection.  - Call for new or changing lesions.  LENTIGINES, SEBORRHEIC KERATOSES, HEMANGIOMAS - Benign normal skin lesions - Benign-appearing - Call for any changes  MELANOCYTIC NEVI - Tan-brown and/or pink-flesh-colored symmetric macules and papules - Benign appearing on exam today - Observation - Call clinic for new or changing moles - Recommend daily use of broad spectrum spf 30+ sunscreen to sun-exposed areas.   Nevus left scalp suprauricular : 0.3 cm regular brown macule   See previous picture   Left temple at hairline  See photo  Benign-appearing.  Observation.  Call clinic for new or changing lesions.  Recommend daily use of broad spectrum spf 30+ sunscreen to sun-exposed areas.    Keratosis Pilaris at Arms  - Tiny follicular keratotic papules - Benign. Genetic in nature. No cure. - Observe. - If desired, patient can use an emollient (moisturizer) containing ammonium lactate, urea or salicylic acid once a day to smooth the area Recommend Cerave Cream, Amlactin    Hyperpigmentation of elbows worse on Left - PIPA CeraVe cream vs AmLactin reapid relief cream vs Eucerin cream with Urea  Dermatofibroma Left knee area - Firm pink/brown papulenodule with dimple sign - Benign appearing - Call for any changes  Acne vulgaris Exam : mild comedones at face Chronic; persistent but improved Continue Clindamycin-tretinoin gel apply to face daily and nose Topical retinoid medications like tretinoin/Retin-A, adapalene/Differin, tazarotene/Fabior, and Epiduo/Epiduo Forte can cause dryness and irritation when first started. Only apply  a pea-sized amount to the entire affected area. Avoid applying it around the eyes, edges of mouth and creases at the nose. If you experience irritation, use a good moisturizer first  and/or apply the medicine less often. If you are doing well with the medicine, you can increase how often you use it until you are applying every night. Be careful with sun protection while using this medication as it can make you sensitive to the sun. This medicine should not be used by pregnant women.  Plan:  Start Clindamycin lotion- apply daily to face as needed for acne Start tretinoin 0.025 % - apply pea sized amount to face qhs as tolerated.   Topical retinoid medications like tretinoin/Retin-A, adapalene/Differin, tazarotene/Fabior, and Epiduo/Epiduo Forte can cause dryness and irritation when first started. Only apply a pea-sized amount to the entire affected area. Avoid applying it around the eyes, edges of mouth and creases at the nose. If you experience irritation, use a good moisturizer first and/or apply the medicine less often. If you are doing well with the medicine, you can increase how often you use it until you are applying every night. Be careful with sun protection while using this medication as it can make you sensitive to the sun. This medicine should not be used by pregnant women.    Psoriasis scalp and eyebrows Exam: Scaliness of scalp and eyebrows  Chronic and persistent condition with duration or expected duration over one year. Condition is improving with treatment but not currently at goal. Counseling on psoriasis and coordination of care  psoriasis is a chronic non-curable, but treatable genetic/hereditary disease that may have other systemic features affecting other organ systems such as joints (Psoriatic Arthritis). It is associated with an increased risk of inflammatory bowel disease, heart disease, non-alcoholic fatty liver disease, and depression.  Treatments include light and laser treatments; topical medications; and systemic medications including oral and injectables.   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  In women androgenetic alopecia is commonly associated with menopause but may occur any time after puberty.  It causes hair thinning primarily on the crown with widening of the part and temporal hairline recession.  Can use OTC Rogaine (minoxidil) 5% solution/foam as directed.  Oral treatments in female patients who have no contraindication may include : - Low dose oral minoxidil 1.25 - 5mg  daily - Spironolactone 50 - 100mg  bid - Finasteride 2.5 - 5 mg daily Adjunctive therapies include: - Low Level Laser Light Therapy (LLLT) - Platelet-rich plasma injections (PRP) - Hair Transplants or scalp reduction   Patient deferred starting any treatments today. Will consider.  Recommend starting Biotin 2.5 mg capsule by mouth daily  Discussed minoxidil 2.5 taking 1/2 tab by mouth daily  Patient deferred starting at this time.  Recommend Biotin 2.5 mg po qd   Discussed Nutrafol otc supplements   Doses of oral minoxidil for hair loss are considered 'low dose'. This is because the doses used for hair loss are much lower than the doses which are used for conditions such as high blood pressure (hypertension). The doses used for hypertension are 10-40mg  per day.  Side effects are uncommon at the low doses (up to 2.5 mg/day) used to treat hair loss. Potential side effects, more commonly seen at higher doses, include: Increase in hair growth (hypertrichosis)  elsewhere on face and body Temporary hair shedding upon starting medication which may last up to 4 weeks Ankle swelling, fluid retention, rapid weight gain more than 5 pounds Low blood pressure and feeling lightheaded or dizzy when standing up quickly Fast or irregular heartbeat Headaches  Benign Growth at left forearm - probable lipoma  Exam: 0.5 cm mobile semi-firm compressible  subcutaneous nodule on left mid forearm Treatment Plan: Present and unchanging for years, per patient report. Likely benign. Requires excisional biopsy to characterize. Patient prefers observation  History of Dysplastic Nevi Left posterior ear 11/16/2020 clear  Right lateral to crown scalp/mod 11/11/19  - No evidence of recurrence today - Recommend regular full body skin exams - Recommend daily broad spectrum sunscreen SPF 30+ to sun-exposed areas, reapply every 2 hours as needed.  - Call if any new or changing lesions are noted between office visits  ACNE VULGARIS   Related Medications clindamycin-tretinoin (VELTIN) gel Use pea sized amount to face and nose at bedtime. tretinoin (RETIN-A) 0.025 % cream Apply pea sized amount to face qhs as tolerated for acne clindamycin (CLEOCIN-T) 1 % lotion Apply topically daily. For acne NEOPLASM OF UNCERTAIN BEHAVIOR right lateral to crown scalp Epidermal / dermal shaving  Lesion diameter (cm):  0.6 Informed consent: discussed and consent obtained   Timeout: patient name, date of birth, surgical site, and procedure verified   Procedure prep:  Patient was prepped and draped in usual sterile fashion Prep type:  Isopropyl alcohol Anesthesia: the lesion was anesthetized in a standard fashion   Anesthetic:  1% lidocaine w/ epinephrine 1-100,000 buffered w/ 8.4% NaHCO3 Instrument used: flexible razor blade   Hemostasis achieved with: pressure, aluminum chloride and electrodesiccation   Outcome: patient tolerated procedure well   Post-procedure details: sterile dressing applied and wound care instructions given   Dressing type: bandage and petrolatum   Specimen 1 - Surgical pathology Differential Diagnosis: r/o recurrent dysplastic nevus   Check Margins: No  Refer to previous pathology dated 11/11/2019 R/o recurrent dysplastic nevus  Return in about 1 year (around 12/10/2024) for TBSE.  IAsher Muir, CMA, am acting as scribe for Armida Sans, MD.   Documentation: I have reviewed the above documentation for accuracy and completeness, and I agree with the above.  Armida Sans, MD

## 2023-12-12 ENCOUNTER — Ambulatory Visit: Payer: BC Managed Care – PPO | Admitting: Dermatology

## 2023-12-14 ENCOUNTER — Telehealth: Payer: Self-pay

## 2023-12-14 ENCOUNTER — Encounter: Payer: Self-pay | Admitting: Dermatology

## 2023-12-14 LAB — SURGICAL PATHOLOGY

## 2023-12-14 NOTE — Telephone Encounter (Addendum)
Called and discussed bx results with patient. She verbalized understanding and denied further questions.    ----- Message from Armida Sans sent at 12/14/2023  5:01 PM EST ----- FINAL DIAGNOSIS        1. Skin, right lateral to crown scalp :       RECURRENT MELANOCYTIC NEVUS   Benign mole - recurrent No further treatment at this time Recheck next visit

## 2024-01-29 ENCOUNTER — Other Ambulatory Visit: Payer: Self-pay | Admitting: Obstetrics and Gynecology

## 2024-01-29 DIAGNOSIS — Z1231 Encounter for screening mammogram for malignant neoplasm of breast: Secondary | ICD-10-CM

## 2024-03-04 ENCOUNTER — Encounter (HOSPITAL_COMMUNITY): Payer: Self-pay

## 2024-03-11 ENCOUNTER — Encounter

## 2024-03-13 ENCOUNTER — Telehealth: Payer: Self-pay

## 2024-03-13 MED ORDER — AKLIEF 0.005 % EX CREA: 1.0000 | TOPICAL_CREAM | Freq: Every evening | CUTANEOUS | 0 refills | Status: AC

## 2024-03-13 NOTE — Telephone Encounter (Signed)
 Insurance is denying tretinoin  0.025% cream, wants patient to try and fail 3 alternatives:  Adapalane Benzoyl peroxide Clindamycin  gel Dapsone tazarotene AKLIEF EPIDUO TWYNEO WINLEVI  Please advise

## 2024-03-25 ENCOUNTER — Ambulatory Visit
Admission: RE | Admit: 2024-03-25 | Discharge: 2024-03-25 | Disposition: A | Discharge status: Home / Self Care | Source: Ambulatory Visit | Attending: Obstetrics and Gynecology | Admitting: Obstetrics and Gynecology

## 2024-03-25 DIAGNOSIS — Z1231 Encounter for screening mammogram for malignant neoplasm of breast: Secondary | ICD-10-CM | POA: Diagnosis present

## 2024-03-26 ENCOUNTER — Other Ambulatory Visit: Payer: Self-pay | Admitting: Physical Medicine and Rehabilitation

## 2024-03-26 ENCOUNTER — Encounter: Payer: Self-pay | Admitting: Physical Medicine and Rehabilitation

## 2024-03-26 DIAGNOSIS — M5416 Radiculopathy, lumbar region: Secondary | ICD-10-CM

## 2024-03-27 ENCOUNTER — Encounter: Payer: Self-pay | Admitting: Physical Medicine and Rehabilitation

## 2024-03-28 NOTE — Telephone Encounter (Signed)
 Called patient and advised her of information via phone. aw

## 2024-04-10 ENCOUNTER — Ambulatory Visit: Payer: Self-pay | Admitting: Obstetrics and Gynecology

## 2024-04-15 ENCOUNTER — Ambulatory Visit
Admission: RE | Admit: 2024-04-15 | Discharge: 2024-04-15 | Disposition: A | Discharge status: Home / Self Care | Source: Ambulatory Visit | Attending: Physical Medicine and Rehabilitation | Admitting: Physical Medicine and Rehabilitation

## 2024-04-15 DIAGNOSIS — M5416 Radiculopathy, lumbar region: Secondary | ICD-10-CM

## 2024-04-22 ENCOUNTER — Other Ambulatory Visit
Admission: RE | Admit: 2024-04-22 | Discharge: 2024-04-22 | Disposition: A | Discharge status: Home / Self Care | Source: Ambulatory Visit | Attending: Internal Medicine | Admitting: Internal Medicine

## 2024-04-22 DIAGNOSIS — Z0000 Encounter for general adult medical examination without abnormal findings: Secondary | ICD-10-CM | POA: Diagnosis present

## 2024-04-22 DIAGNOSIS — R079 Chest pain, unspecified: Secondary | ICD-10-CM | POA: Diagnosis present

## 2024-04-22 LAB — D-DIMER, QUANTITATIVE: D-Dimer, Quant: <0.27 ug{FEU}/mL (ref 0.00–0.50)

## 2024-04-22 LAB — TROPONIN I (HIGH SENSITIVITY): Troponin I (High Sensitivity): <2 ng/L (ref <18)

## 2024-07-22 ENCOUNTER — Ambulatory Visit
Admission: RE | Admit: 2024-07-22 | Discharge: 2024-07-22 | Disposition: A | Discharge status: Home / Self Care | Source: Ambulatory Visit | Attending: Gastroenterology | Admitting: Gastroenterology

## 2024-07-22 ENCOUNTER — Other Ambulatory Visit: Payer: Self-pay | Admitting: Gastroenterology

## 2024-07-22 ENCOUNTER — Encounter: Payer: Self-pay | Admitting: Gastroenterology

## 2024-07-22 DIAGNOSIS — R1032 Left lower quadrant pain: Secondary | ICD-10-CM

## 2024-07-22 DIAGNOSIS — Z8719 Personal history of other diseases of the digestive system: Secondary | ICD-10-CM

## 2024-07-22 MED ORDER — IOPAMIDOL (ISOVUE-300) INJECTION 61%
100.0000 mL | Freq: Once | INTRAVENOUS | Status: AC | PRN
Start: 2024-07-22 — End: 2024-07-22
  Administered 2024-07-22: 100 mL via INTRAVENOUS

## 2024-08-12 ENCOUNTER — Ambulatory Visit: Attending: Rheumatology

## 2024-08-12 DIAGNOSIS — M25541 Pain in joints of right hand: Secondary | ICD-10-CM | POA: Insufficient documentation

## 2024-08-12 DIAGNOSIS — R29898 Other symptoms and signs involving the musculoskeletal system: Secondary | ICD-10-CM | POA: Insufficient documentation

## 2024-08-12 DIAGNOSIS — M6281 Muscle weakness (generalized): Secondary | ICD-10-CM | POA: Insufficient documentation

## 2024-08-12 DIAGNOSIS — R278 Other lack of coordination: Secondary | ICD-10-CM | POA: Insufficient documentation

## 2024-08-12 NOTE — Therapy (Addendum)
 OUTPATIENT OCCUPATIONAL THERAPY ORTHO EVALUATION  Patient Name: Janet Campos MRN: [id number] DOB:Oct 29, 1960, 63 y.o., female Today's Date: 08/14/2024  PCP: Dr. Layman Campos REFERRING PROVIDER: Dr. Lady Campos (Rheumatology)  END OF SESSION:  OT End of Session - 08/14/24 1005     Visit Number 1    Number of Visits 24    Date for Recertification  11/04/24    OT Start Time 1145    OT Stop Time 1230    OT Time Calculation (min) 45 min    Activity Tolerance Patient tolerated treatment well    Behavior During Therapy [facility] for tasks assessed/performed         Past Medical History:  Diagnosis Date   Abnormal Pap smear of cervix 2004   Diverticulitis    History of dysplastic nevus 11/11/2019    right lateral to crown scalp/moderate   History of dysplastic nevus 10/2020   left posterior ear compound nevus with mod atypia   Hyperlipidemia    Liver disease    Fatty liver   Ulcerative colitis (HCC)    Past Surgical History:  Procedure Laterality Date   COLONOSCOPY     COLPOSCOPY  2004   DILATION AND CURETTAGE OF UTERUS     TUBAL LIGATION  1989   VARICOSE VEIN SURGERY     vulvar hematoma     Patient Active Problem List   Diagnosis Date Noted   Headache disorder 12/11/2019   Functional bowel disorder 07/30/2019   Myalgia due to statin 05/25/2018   NASH (nonalcoholic steatohepatitis) 09/07/2016   GERD (gastroesophageal reflux disease) 05/30/2014   Hypercholesteremia 04/06/2014   Lumbar disc herniation 04/03/2014   ONSET DATE: 1 year ago  REFERRING DIAG:  M25.50 (ICD-10-CM) - Pain in unspecified joint  R29.898 (ICD-10-CM) - Other symptoms and signs involving the musculoskeletal system   THERAPY DIAG:  Other symptoms and signs involving the musculoskeletal system  Arthralgia of right hand  Muscle weakness (generalized)  Other lack of coordination  Rationale for Evaluation and Treatment: Rehabilitation  SUBJECTIVE:  SUBJECTIVE STATEMENT: Pt reports that  her pain isn't all the time, but when it's present, it's hard to use her hands comfortably. Pt accompanied by: self  PERTINENT HISTORY: Per rheumatology note on 08/05/24 (Dr. Blanch):  Joint Pain    I have been asked to see this patient in consultation by Dr.Menz. I have reviewed the medical records from Dr.Menz. Janet Campos is a 63 y.o. female is here today for evaluation of joint pains who has medical history of psoriasis (scalp), hyperlipidemia, diverticulitis, NASH. She has pain of the hands, left knee and pelvic region. She has decrease in grip strength. She has noticed occasional swelling of the hand joints that last for few days. She does get some lower back pain and stiffness in the morning. This improves with movement. She has no history of eye inflammation. She has no achilles pain or swelling. She denies any epicondyle pains. She does take Tylenol for pain control.    08/12/24: Pt also endorses hx of scoliosis and arthritis in back, and reports she is planning on an injection in the back next week.  PRECAUTIONS: None  RED FLAGS: None   WEIGHT BEARING RESTRICTIONS: No  PAIN: Pain seems worse in the L hand  Are you having pain? Yes: NPRS scale: 0/10, can get up to 8/10 pain  Pain location: bilat index fingers, MP joint, occasionally the thumbs and middle fingers   Pain description: achy, occasionally shooting Aggravating factors: sometimes  eating tomatoes  Relieving factors: avoiding tomatoes    FALLS: Has patient fallen in last 6 months? No  LIVING ENVIRONMENT: Lives with: lives alone Has following equipment at home: None  PLOF: Independent and cares for her young grandchildren 2-3 times per week (one child is 15 months, another almost 3, and the 63rd is 63 years old).  Pt is retired from [employer].    PATIENT GOALS: Get more strength in my hands.   NEXT MD VISIT: Return in about 6 months (around 02/03/2025) for Routine Follow Up, per medical record  from Dr. Tobie  OBJECTIVE:  Note: Objective measures were completed at Evaluation unless otherwise noted.  HAND DOMINANCE: Right  ADLs: Eating: able to cut food Grooming: occasional pain when holding a toothbrush during a flare up Upper body dressing: difficulty pulling up a zipper on a dress (painful)  Lower body dressing: indep Toileting: indep Bathing: indep Meal prep: difficulty opening containers  UPPER EXTREMITY ROM:  BUEs WNL; able to oppose each digit to thumb and make full composite fists in each hand; no joint deformities observed in BUEs  UPPER EXTREMITY MMT:  BUEs grossly 4+-5/5 throughout shoulders, elbows, wrists  HAND FUNCTION: Grip strength: Right: 50 lbs; Left: 52 lbs, Lateral pinch: Right: 12 lbs, Left: 8 lbs, and 3 point pinch: Right: 9 lbs, Left: 10 lbs (mild pain with 3 point pinch)   COORDINATION: 9 Hole Peg test: Right: NT sec; Left: NT sec; pt reports no difficulties picking up and manipulating small ADL supplies in either hand  SENSATION: WFL  EDEMA: No visible edema, but pt reports that when she has a flare up, her MCP joint in the 2nd and 3rd digits tend to swell in either hand.  COGNITION: Overall cognitive status: Within functional limits for tasks assessed  OBSERVATIONS:  Pt pleasant, cooperative, and receptive to [employer]   TREATMENT DATE: 08/12/24 Evaluation completed.  Self Care: -Review of OT role, goals, poc.   -Recommended trial of wearing compression gloves while driving as pt reports she often experiences pain in hands while driving.  Advised on increasing attention to more relaxed grip of steering wheel.  PATIENT EDUCATION: Education details: OT role, goals, poc Person educated: Patient Education method: Explanation and Verbal cues Education comprehension: verbalized understanding  HOME EXERCISE PROGRAM: Pink theraputty: handout issued and reviewed  GOALS: Goals reviewed with patient? Yes  SHORT TERM GOALS: Target date:  08/26/24 (2 weeks)  Pt will be indep to perform HEP for improving bilat hand strength and maintaining BUE ROM for daily tasks. Baseline: Eval: Not yet initiated Goal status: INITIAL  2.  Pt will be indep to verbalize 2-3 joint protection/cumulative trauma prevention strategies to reduce pain in bilat hands.  Baseline: Eval: Education not yet initiated Goal status: INITIAL  3.  Pt will identify and implement 1-2 compensatory strategies and/or pieces of AE in order to ease performance of daily tasks.  Baseline: Eval: Education not yet initiated Goal status: INITIAL  LONG TERM GOALS: Target date: 09/09/24 (4 weeks)  Pt will increase bilat grip strength by 10 or more lbs in order to ease ability to pick up/hold grandbaby. Baseline: Eval: R grip 50 lbs (L non-dominant 52 lbs) Goal status: INITIAL  2.  Pt will tolerate manual therapy, therapeutic modalities, and exercises to decrease pain in bilat hands to a reported 4/10 pain or less with activity.  Baseline: Eval: Up to 8/10 pain in bilat hands with activity during a flare up Goal status: INITIAL  ASSESSMENT:  CLINICAL IMPRESSION: Patient is a 63 y.o. female who was seen today for occupational therapy evaluation for functional decline related to polyarthralgia, specifically experiencing intermittent pain and inflammation in bilat hands, and endorses increased weakness in the hands.  Pt is very active at baseline, caring for her young grandchildren 2-3 days per week, and acknowledges needing to maintain/improve strength to care for them, as she needs to be able to pick up the youngest child (1 y/o).  Pain in hands is severe during a flare up.  Pt will benefit from skilled OT to provide instruction in HEP, education on conservative pain management strategies, and education on joint protection/activity modification/AE in order to maximize indep and tolerance with ADL/IADL tasks.    PERFORMANCE DEFICITS: in functional skills including ADLs,  IADLs, edema, strength, pain, body mechanics, decreased knowledge of precautions, decreased knowledge of use of DME, and UE functional use, and psychosocial skills including coping strategies, environmental adaptation, habits, and routines and behaviors.   IMPAIRMENTS: are limiting patient from ADLs, IADLs, rest and sleep, work, and leisure (work being regular care giving for young grandchildren)  COMORBIDITIES: has co-morbidities such as HA disorder, lumbar disc herniation, scoliosis, polyarthralgia that affects occupational performance. Patient will benefit from skilled OT to address above impairments and improve overall function.  MODIFICATION OR ASSISTANCE TO COMPLETE EVALUATION: No modification of tasks or assist necessary to complete an evaluation.  OT OCCUPATIONAL PROFILE AND HISTORY: Detailed assessment: Review of records and additional review of physical, cognitive, psychosocial history related to current functional performance.  CLINICAL DECISION MAKING: Moderate - several treatment options, min-mod task modification necessary  REHAB POTENTIAL: Good  EVALUATION COMPLEXITY: Moderate      PLAN:  OT FREQUENCY: 1-2x/week  OT DURATION: 12 weeks (anticipate 2-4 more sessions, but dates extended to allow ease of scheduling d/t pt's busy schedule for caring for grandchildren out of town regularly throughout the week).  PLANNED INTERVENTIONS: 97168 OT Re-evaluation, 97535 self care/ADL training, 02889 therapeutic exercise, 97530 therapeutic activity, 97140 manual therapy, 97035 ultrasound, 97018 paraffin, 02989 moist heat, 97010 cryotherapy, 97034 contrast bath, 97750 Physical Performance Testing, 02239 Orthotic Initial, 97763 Orthotic/Prosthetic subsequent, passive range of motion, psychosocial skills training, energy conservation, coping strategies training, patient/family education, and DME and/or AE instructions  RECOMMENDED OTHER SERVICES: None at this time  CONSULTED AND AGREED  WITH PLAN OF CARE: Patient  PLAN FOR NEXT SESSION: see plan above  Inocente Blazing, MS, OTR/L  Inocente MARLA Blazing, OT 08/14/2024, 10:06 AM

## 2024-08-14 ENCOUNTER — Ambulatory Visit

## 2024-08-20 ENCOUNTER — Ambulatory Visit: Admitting: Occupational Therapy

## 2024-08-22 ENCOUNTER — Ambulatory Visit: Admitting: Occupational Therapy

## 2024-08-23 ENCOUNTER — Ambulatory Visit

## 2024-08-23 DIAGNOSIS — R29898 Other symptoms and signs involving the musculoskeletal system: Secondary | ICD-10-CM

## 2024-08-23 DIAGNOSIS — M25541 Pain in joints of right hand: Secondary | ICD-10-CM

## 2024-08-23 DIAGNOSIS — R278 Other lack of coordination: Secondary | ICD-10-CM

## 2024-08-23 DIAGNOSIS — M6281 Muscle weakness (generalized): Secondary | ICD-10-CM

## 2024-08-23 NOTE — Therapy (Signed)
 OUTPATIENT OCCUPATIONAL THERAPY ORTHO TREATMENT NOTE Patient Name: XEE HOLLMAN MRN: [id number] DOB:[birth year], 63 y.o., female Today's Date: 08/23/2024  PCP: Dr. Layman Piety REFERRING PROVIDER: Dr. Lady Blanch (Rheumatology)  END OF SESSION:  OT End of Session - 08/23/24 1553     Visit Number 2    Number of Visits 24    Date for Recertification  11/04/24    OT Start Time 0851    OT Stop Time 0930    OT Time Calculation (min) 39 min    Activity Tolerance Patient tolerated treatment well    Behavior During Therapy [facility] for tasks assessed/performed          Past Medical History:  Diagnosis Date   Abnormal Pap smear of cervix 2004   Diverticulitis    History of dysplastic nevus 11/11/2019    right lateral to crown scalp/moderate   History of dysplastic nevus 10/2020   left posterior ear compound nevus with mod atypia   Hyperlipidemia    Liver disease    Fatty liver   Ulcerative colitis (HCC)    Past Surgical History:  Procedure Laterality Date   COLONOSCOPY     COLPOSCOPY  2004   DILATION AND CURETTAGE OF UTERUS     TUBAL LIGATION  1989   VARICOSE VEIN SURGERY     vulvar hematoma     Patient Active Problem List   Diagnosis Date Noted   Headache disorder 12/11/2019   Functional bowel disorder 07/30/2019   Myalgia due to statin 05/25/2018   NASH (nonalcoholic steatohepatitis) 09/07/2016   GERD (gastroesophageal reflux disease) 05/30/2014   Hypercholesteremia 04/06/2014   Lumbar disc herniation 04/03/2014   ONSET DATE: 1 year ago  REFERRING DIAG:  M25.50 (ICD-10-CM) - Pain in unspecified joint  R29.898 (ICD-10-CM) - Other symptoms and signs involving the musculoskeletal system   THERAPY DIAG:  Muscle weakness (generalized)  Arthralgia of right hand  Other symptoms and signs involving the musculoskeletal system  Other lack of coordination  Rationale for Evaluation and Treatment: Rehabilitation  SUBJECTIVE:  SUBJECTIVE STATEMENT: Pt reports  less pain overall, and she continues to watch what she eats.  Pt has noticed nightshades may be contributing to increased pain levels.  Pt accompanied by: self  PERTINENT HISTORY: Per rheumatology note on 08/05/24 (Dr. Blanch):  Joint Pain    I have been asked to see this patient in consultation by Dr.Menz. I have reviewed the medical records from Dr.Menz. PETRINA MELBY is a 63 y.o. female is here today for evaluation of joint pains who has medical history of psoriasis (scalp), hyperlipidemia, diverticulitis, NASH. She has pain of the hands, left knee and pelvic region. She has decrease in grip strength. She has noticed occasional swelling of the hand joints that last for few days. She does get some lower back pain and stiffness in the morning. This improves with movement. She has no history of eye inflammation. She has no achilles pain or swelling. She denies any epicondyle pains. She does take Tylenol for pain control.    08/12/24: Pt also endorses hx of scoliosis and arthritis in back, and reports she is planning on an injection in the back next week.  PRECAUTIONS: None  RED FLAGS: None   WEIGHT BEARING RESTRICTIONS: No  PAIN: Pt reports minimal pain in hands today, more so has pain in the wrists when lifting  Are you having pain? Yes: NPRS scale: 0/10, can get up to 8/10 pain  Pain location: bilat index fingers, MP joint,  occasionally the thumbs and middle fingers   Pain description: achy, occasionally shooting Aggravating factors: sometimes eating tomatoes  Relieving factors: avoiding tomatoes    FALLS: Has patient fallen in last 6 months? No  LIVING ENVIRONMENT: Lives with: lives alone Has following equipment at home: None  PLOF: Independent and cares for her young grandchildren 2-3 times per week (one child is 15 months, another almost 3, and the 72rd is 63 years old).  Pt is retired from [employer].    PATIENT GOALS: Get more strength in my hands.   NEXT  MD VISIT: Return in about 6 months (around 02/03/2025) for Routine Follow Up, per medical record from Dr. Tobie  OBJECTIVE:  Note: Objective measures were completed at Evaluation unless otherwise noted.  HAND DOMINANCE: Right  ADLs: Eating: able to cut food Grooming: occasional pain when holding a toothbrush during a flare up Upper body dressing: difficulty pulling up a zipper on a dress (painful)  Lower body dressing: indep Toileting: indep Bathing: indep Meal prep: difficulty opening containers  UPPER EXTREMITY ROM:  BUEs WNL; able to oppose each digit to thumb and make full composite fists in each hand; no joint deformities observed in BUEs  UPPER EXTREMITY MMT:  BUEs grossly 4+-5/5 throughout shoulders, elbows, wrists  HAND FUNCTION: Grip strength: Right: 50 lbs; Left: 52 lbs, Lateral pinch: Right: 12 lbs, Left: 8 lbs, and 3 point pinch: Right: 9 lbs, Left: 10 lbs (mild pain with 3 point pinch)   COORDINATION: 9 Hole Peg test: Right: NT sec; Left: NT sec; pt reports no difficulties picking up and manipulating small ADL supplies in either hand  SENSATION: WFL  EDEMA: No visible edema, but pt reports that when she has a flare up, her MCP joint in the 2nd and 3rd digits tend to swell in either hand.  COGNITION: Overall cognitive status: Within functional limits for tasks assessed  OBSERVATIONS:  Pt pleasant, cooperative, and receptive to [employer]   TREATMENT DATE: 08/23/24 Paraffin:  X10 min for R/L hand pain management/muscle relaxation.  Therapeutic Exercise: Issued yellow putty (downgraded from pink).  Reviewed digit abd technique from putty handout; good return demo.  Encouraged low reps/reducing putty use as needed, including limiting self to a few exercises at a time in order to avoid pain in hands.   Self Care: -Pt education: Condition management: Issued handout/reviewed joint protection strategies for the hands -Issued handout/reviewed technique for contrast bath at  home, to perform for pain and edema reduction as needed -Advised on possible use of wrist brace when picking up grandchildren vs focusing on neutral wrist positions when lifting.  Pt plans to focus on body mechanics to avoid use of brace. -Continue to monitor reactions to eating nightshades -Advised on options to obtain paraffin for pain management if desired  PATIENT EDUCATION: Education details: Condition management education Person educated: Patient Education method: Explanation and Verbal cues Education comprehension: verbalized understanding  HOME EXERCISE PROGRAM: To be initiated in follow up sessions  GOALS: Goals reviewed with patient? Yes  SHORT TERM GOALS: Target date: 08/26/24 (2 weeks)  Pt will be indep to perform HEP for improving bilat hand strength and maintaining BUE ROM for daily tasks. Baseline: Eval: Not yet initiated Goal status: INITIAL  2.  Pt will be indep to verbalize 2-3 joint protection/cumulative trauma prevention strategies to reduce pain in bilat hands.  Baseline: Eval: Education not yet initiated Goal status: INITIAL  3.  Pt will identify and implement 1-2 compensatory strategies and/or pieces of  AE in order to ease performance of daily tasks.  Baseline: Eval: Education not yet initiated Goal status: INITIAL  LONG TERM GOALS: Target date: 09/09/24 (4 weeks)  Pt will increase bilat grip strength by 10 or more lbs in order to ease ability to pick up/hold grandbaby. Baseline: Eval: R grip 50 lbs (L non-dominant 52 lbs) Goal status: INITIAL  2.  Pt will tolerate manual therapy, therapeutic modalities, and exercises to decrease pain in bilat hands to a reported 4/10 pain or less with activity.  Baseline: Eval: Up to 8/10 pain in bilat hands with activity during a flare up; 2/10  Goal status: INITIAL  ASSESSMENT:  CLINICAL IMPRESSION: Pt with good tolerance to paraffin wax tx for pain management to bilat hands.  Pt receptive to education provided  this session which focussed on joint protection, pain management/edema management/and HEP downgrade.  Pt reports minimal pain in hands today, but some wrist pain when lifting her grandchildren.  Pt verbalized understanding of lifting techniques and plans to focus on body mechanics to prevent/minimize pain with future lifting.  Pt will continue to benefit from skilled OT to provide instruction in HEP, education on conservative pain management strategies, and education on joint protection/activity modification/AE in order to maximize indep and tolerance with ADL/IADL tasks.    PERFORMANCE DEFICITS: in functional skills including ADLs, IADLs, edema, strength, pain, body mechanics, decreased knowledge of precautions, decreased knowledge of use of DME, and UE functional use, and psychosocial skills including coping strategies, environmental adaptation, habits, and routines and behaviors.   IMPAIRMENTS: are limiting patient from ADLs, IADLs, rest and sleep, work, and leisure (work being regular care giving for young grandchildren)  COMORBIDITIES: has co-morbidities such as HA disorder, lumbar disc herniation, scoliosis, polyarthralgia that affects occupational performance. Patient will benefit from skilled OT to address above impairments and improve overall function.  MODIFICATION OR ASSISTANCE TO COMPLETE EVALUATION: No modification of tasks or assist necessary to complete an evaluation.  OT OCCUPATIONAL PROFILE AND HISTORY: Detailed assessment: Review of records and additional review of physical, cognitive, psychosocial history related to current functional performance.  CLINICAL DECISION MAKING: Moderate - several treatment options, min-mod task modification necessary  REHAB POTENTIAL: Good  EVALUATION COMPLEXITY: Moderate      PLAN:  OT FREQUENCY: 1-2x/week  OT DURATION: 12 weeks (anticipate 2-4 more sessions, but dates extended to allow ease of scheduling d/t pt's busy schedule for caring  for grandchildren out of town regularly throughout the week).  PLANNED INTERVENTIONS: 97168 OT Re-evaluation, 97535 self care/ADL training, 02889 therapeutic exercise, 97530 therapeutic activity, 97140 manual therapy, 97035 ultrasound, 97018 paraffin, 02989 moist heat, 97010 cryotherapy, 97034 contrast bath, 97750 Physical Performance Testing, 02239 Orthotic Initial, 97763 Orthotic/Prosthetic subsequent, passive range of motion, psychosocial skills training, energy conservation, coping strategies training, patient/family education, and DME and/or AE instructions  RECOMMENDED OTHER SERVICES: None at this time  CONSULTED AND AGREED WITH PLAN OF CARE: Patient  PLAN FOR NEXT SESSION: Theraband for BUE strengthening  Inocente Blazing, MS, OTR/L  Inocente MARLA Blazing, OT 08/23/2024, 3:58 PM

## 2024-08-26 ENCOUNTER — Ambulatory Visit

## 2024-08-29 ENCOUNTER — Ambulatory Visit

## 2024-09-03 ENCOUNTER — Ambulatory Visit

## 2024-09-05 ENCOUNTER — Ambulatory Visit

## 2024-09-10 ENCOUNTER — Ambulatory Visit

## 2024-09-12 ENCOUNTER — Ambulatory Visit: Admitting: Occupational Therapy

## 2024-09-13 ENCOUNTER — Ambulatory Visit: Attending: Rheumatology

## 2024-09-13 DIAGNOSIS — R278 Other lack of coordination: Secondary | ICD-10-CM | POA: Insufficient documentation

## 2024-09-13 DIAGNOSIS — R29898 Other symptoms and signs involving the musculoskeletal system: Secondary | ICD-10-CM | POA: Insufficient documentation

## 2024-09-13 DIAGNOSIS — M25541 Pain in joints of right hand: Secondary | ICD-10-CM | POA: Diagnosis present

## 2024-09-13 DIAGNOSIS — M6281 Muscle weakness (generalized): Secondary | ICD-10-CM | POA: Diagnosis present

## 2024-09-13 NOTE — Therapy (Unsigned)
 OUTPATIENT OCCUPATIONAL THERAPY ORTHO TREATMENT NOTE Patient Name: Janet Campos MRN: [id number] DOB:11/23/1960, 63 y.o., female Today's Date: 09/13/2024  PCP: Dr. Layman Piety REFERRING PROVIDER: Dr. Lady Blanch (Rheumatology)  END OF SESSION:    Past Medical History:  Diagnosis Date   Abnormal Pap smear of cervix 2004   Diverticulitis    History of dysplastic nevus 11/11/2019    right lateral to crown scalp/moderate   History of dysplastic nevus 10/2020   left posterior ear compound nevus with mod atypia   Hyperlipidemia    Liver disease    Fatty liver   Ulcerative colitis (HCC)    Past Surgical History:  Procedure Laterality Date   COLONOSCOPY     COLPOSCOPY  2004   DILATION AND CURETTAGE OF UTERUS     TUBAL LIGATION  1989   VARICOSE VEIN SURGERY     vulvar hematoma     Patient Active Problem List   Diagnosis Date Noted   Headache disorder 12/11/2019   Functional bowel disorder 07/30/2019   Myalgia due to statin 05/25/2018   NASH (nonalcoholic steatohepatitis) 09/07/2016   GERD (gastroesophageal reflux disease) 05/30/2014   Hypercholesteremia 04/06/2014   Lumbar disc herniation 04/03/2014   ONSET DATE: 1 year ago  REFERRING DIAG:  M25.50 (ICD-10-CM) - Pain in unspecified joint  R29.898 (ICD-10-CM) - Other symptoms and signs involving the musculoskeletal system   THERAPY DIAG:  No diagnosis found.  Rationale for Evaluation and Treatment: Rehabilitation  SUBJECTIVE:  SUBJECTIVE STATEMENT: Pt reports less pain overall, and she continues to watch what she eats.  Pt has noticed nightshades may be contributing to increased pain levels.  Pt accompanied by: self  PERTINENT HISTORY: Per rheumatology note on 08/05/24 (Dr. Blanch):  Joint Pain    I have been asked to see this patient in consultation by Dr.Menz. I have reviewed the medical records from Dr.Menz. Janet Campos is a 63 y.o. female is here today for evaluation of joint pains who has  medical history of psoriasis (scalp), hyperlipidemia, diverticulitis, NASH. She has pain of the hands, left knee and pelvic region. She has decrease in grip strength. She has noticed occasional swelling of the hand joints that last for few days. She does get some lower back pain and stiffness in the morning. This improves with movement. She has no history of eye inflammation. She has no achilles pain or swelling. She denies any epicondyle pains. She does take Tylenol for pain control.    08/12/24: Pt also endorses hx of scoliosis and arthritis in back, and reports she is planning on an injection in the back next week.  PRECAUTIONS: None  RED FLAGS: None   WEIGHT BEARING RESTRICTIONS: No  PAIN: Pt reports minimal pain in hands today, more so has pain in the wrists when lifting  Are you having pain? Yes: NPRS scale: 0/10, can get up to 8/10 pain  Pain location: bilat index fingers, MP joint, occasionally the thumbs and middle fingers   Pain description: achy, occasionally shooting Aggravating factors: sometimes eating tomatoes  Relieving factors: avoiding tomatoes    FALLS: Has patient fallen in last 6 months? No  LIVING ENVIRONMENT: Lives with: lives alone Has following equipment at home: None  PLOF: Independent and cares for her young grandchildren 2-3 times per week (one child is 15 months, another almost 3, and the 54rd is 63 years old).  Pt is retired from [employer].    PATIENT GOALS: Get more strength in my hands.  NEXT MD VISIT: Return in about 6 months (around 02/03/2025) for Routine Follow Up, per medical record from Dr. Tobie  OBJECTIVE:  Note: Objective measures were completed at Evaluation unless otherwise noted.  HAND DOMINANCE: Right  ADLs: Eating: able to cut food Grooming: occasional pain when holding a toothbrush during a flare up Upper body dressing: difficulty pulling up a zipper on a dress (painful)  Lower body dressing: indep Toileting:  indep Bathing: indep Meal prep: difficulty opening containers  UPPER EXTREMITY ROM:  BUEs WNL; able to oppose each digit to thumb and make full composite fists in each hand; no joint deformities observed in BUEs  UPPER EXTREMITY MMT:  BUEs grossly 4+-5/5 throughout shoulders, elbows, wrists  HAND FUNCTION: Grip strength: Right: 50 lbs; Left: 52 lbs, Lateral pinch: Right: 12 lbs, Left: 8 lbs, and 3 point pinch: Right: 9 lbs, Left: 10 lbs (mild pain with 3 point pinch) 09/13/24: Right: 60 lbs; Left: 54 lbs;  v   COORDINATION: 9 Hole Peg test: Right: NT sec; Left: NT sec; pt reports no difficulties picking up and manipulating small ADL supplies in either hand  SENSATION: WFL  EDEMA: No visible edema, but pt reports that when she has a flare up, her MCP joint in the 2nd and 3rd digits tend to swell in either hand.  COGNITION: Overall cognitive status: Within functional limits for tasks assessed  OBSERVATIONS:  Pt pleasant, cooperative, and receptive to [employer]   TREATMENT DATE: 08/23/24 Paraffin:  X10 min for R/L hand pain management/muscle relaxation.  Therapeutic Exercise: Issued yellow putty (downgraded from pink).  Reviewed digit abd technique from putty handout; good return demo.  Encouraged low reps/reducing putty use as needed, including limiting self to a few exercises at a time in order to avoid pain in hands.   Self Care: -Pt education: Condition management: Issued handout/reviewed joint protection strategies for the hands -Issued handout/reviewed technique for contrast bath at home, to perform for pain and edema reduction as needed -Advised on possible use of wrist brace when picking up grandchildren vs focusing on neutral wrist positions when lifting.  Pt plans to focus on body mechanics to avoid use of brace. -Continue to monitor reactions to eating nightshades -Advised on options to obtain paraffin for pain management if desired  PATIENT EDUCATION: Education details:  Condition management education Person educated: Patient Education method: Explanation and Verbal cues Education comprehension: verbalized understanding  HOME EXERCISE PROGRAM: To be initiated in follow up sessions  GOALS: Goals reviewed with patient? Yes  SHORT TERM GOALS: Target date: 08/26/24 (2 weeks)  Pt will be indep to perform HEP for improving bilat hand strength and maintaining BUE ROM for daily tasks. Baseline: Eval: Not yet initiated Goal status: INITIAL  2.  Pt will be indep to verbalize 2-3 joint protection/cumulative trauma prevention strategies to reduce pain in bilat hands.  Baseline: Eval: Education not yet initiated Goal status: INITIAL  3.  Pt will identify and implement 1-2 compensatory strategies and/or pieces of AE in order to ease performance of daily tasks.  Baseline: Eval: Education not yet initiated Goal status: INITIAL  LONG TERM GOALS: Target date: 09/09/24 (4 weeks)  Pt will increase bilat grip strength by 10 or more lbs in order to ease ability to pick up/hold grandbaby. Baseline: Eval: R grip 50 lbs (L non-dominant 52 lbs) Goal status: INITIAL  2.  Pt will tolerate manual therapy, therapeutic modalities, and exercises to decrease pain in bilat hands to a reported 4/10 pain or less with  activity.  Baseline: Eval: Up to 8/10 pain in bilat hands with activity during a flare up; 2/10; 09/13/24: 8/ Goal status: INITIAL  ASSESSMENT:  CLINICAL IMPRESSION: Pt with good tolerance to paraffin wax tx for pain management to bilat hands.  Pt receptive to education provided this session which focussed on joint protection, pain management/edema management/and HEP downgrade.  Pt reports minimal pain in hands today, but some wrist pain when lifting her grandchildren.  Pt verbalized understanding of lifting techniques and plans to focus on body mechanics to prevent/minimize pain with future lifting.  Pt will continue to benefit from skilled OT to provide instruction  in HEP, education on conservative pain management strategies, and education on joint protection/activity modification/AE in order to maximize indep and tolerance with ADL/IADL tasks.    PERFORMANCE DEFICITS: in functional skills including ADLs, IADLs, edema, strength, pain, body mechanics, decreased knowledge of precautions, decreased knowledge of use of DME, and UE functional use, and psychosocial skills including coping strategies, environmental adaptation, habits, and routines and behaviors.   IMPAIRMENTS: are limiting patient from ADLs, IADLs, rest and sleep, work, and leisure (work being regular care giving for young grandchildren)  COMORBIDITIES: has co-morbidities such as HA disorder, lumbar disc herniation, scoliosis, polyarthralgia that affects occupational performance. Patient will benefit from skilled OT to address above impairments and improve overall function.  MODIFICATION OR ASSISTANCE TO COMPLETE EVALUATION: No modification of tasks or assist necessary to complete an evaluation.  OT OCCUPATIONAL PROFILE AND HISTORY: Detailed assessment: Review of records and additional review of physical, cognitive, psychosocial history related to current functional performance.  CLINICAL DECISION MAKING: Moderate - several treatment options, min-mod task modification necessary  REHAB POTENTIAL: Good  EVALUATION COMPLEXITY: Moderate      PLAN:  OT FREQUENCY: 1-2x/week  OT DURATION: 12 weeks (anticipate 2-4 more sessions, but dates extended to allow ease of scheduling d/t pt's busy schedule for caring for grandchildren out of town regularly throughout the week).  PLANNED INTERVENTIONS: 97168 OT Re-evaluation, 97535 self care/ADL training, 02889 therapeutic exercise, 97530 therapeutic activity, 97140 manual therapy, 97035 ultrasound, 97018 paraffin, 02989 moist heat, 97010 cryotherapy, 97034 contrast bath, 97750 Physical Performance Testing, 02239 Orthotic Initial, 97763 Orthotic/Prosthetic  subsequent, passive range of motion, psychosocial skills training, energy conservation, coping strategies training, patient/family education, and DME and/or AE instructions  RECOMMENDED OTHER SERVICES: None at this time  CONSULTED AND AGREED WITH PLAN OF CARE: Patient  PLAN FOR NEXT SESSION: Theraband for BUE strengthening  Inocente Blazing, MS, OTR/L  Inocente MARLA Blazing, OT 09/13/2024, 8:05 AM

## 2024-09-17 ENCOUNTER — Ambulatory Visit: Admitting: Occupational Therapy

## 2024-09-24 ENCOUNTER — Ambulatory Visit

## 2024-09-26 ENCOUNTER — Ambulatory Visit: Admitting: Occupational Therapy

## 2024-10-01 ENCOUNTER — Ambulatory Visit

## 2024-10-03 ENCOUNTER — Ambulatory Visit: Admitting: Occupational Therapy

## 2024-10-08 ENCOUNTER — Ambulatory Visit

## 2024-10-10 ENCOUNTER — Ambulatory Visit: Admitting: Occupational Therapy

## 2024-10-15 ENCOUNTER — Ambulatory Visit

## 2024-10-22 ENCOUNTER — Ambulatory Visit

## 2024-10-29 ENCOUNTER — Ambulatory Visit

## 2024-10-31 ENCOUNTER — Ambulatory Visit: Admitting: Occupational Therapy

## 2024-12-11 ENCOUNTER — Ambulatory Visit: Payer: 59 | Admitting: Dermatology
